# Patient Record
Sex: Female | Born: 1970 | Race: Black or African American | Hispanic: No | Marital: Single | Smoking: Current every day smoker
Health system: Southern US, Community
[De-identification: ages and names within clinical notes are randomized; demographics above are authoritative.]

## PROBLEM LIST (undated history)

## (undated) DIAGNOSIS — H8109 Meniere's disease, unspecified ear: Secondary | ICD-10-CM

## (undated) DIAGNOSIS — I10 Essential (primary) hypertension: Secondary | ICD-10-CM

## (undated) HISTORY — PX: APPENDECTOMY: SHX54

---

## 2006-04-25 ENCOUNTER — Emergency Department (HOSPITAL_COMMUNITY): Admission: EM | Admit: 2006-04-25 | Discharge: 2006-04-25 | Payer: Self-pay | Admitting: Emergency Medicine

## 2006-09-06 ENCOUNTER — Emergency Department (HOSPITAL_COMMUNITY): Admission: EM | Admit: 2006-09-06 | Discharge: 2006-09-06 | Payer: Self-pay | Admitting: Emergency Medicine

## 2007-04-21 ENCOUNTER — Ambulatory Visit: Payer: Self-pay | Admitting: Internal Medicine

## 2007-04-21 ENCOUNTER — Ambulatory Visit: Payer: Self-pay | Admitting: *Deleted

## 2007-05-18 ENCOUNTER — Ambulatory Visit: Payer: Self-pay | Admitting: Internal Medicine

## 2007-08-04 ENCOUNTER — Emergency Department (HOSPITAL_COMMUNITY): Admission: EM | Admit: 2007-08-04 | Discharge: 2007-08-04 | Payer: Self-pay | Admitting: Emergency Medicine

## 2007-08-07 DIAGNOSIS — I10 Essential (primary) hypertension: Secondary | ICD-10-CM | POA: Insufficient documentation

## 2007-08-08 ENCOUNTER — Ambulatory Visit: Payer: Self-pay | Admitting: Family Medicine

## 2007-08-08 DIAGNOSIS — H8309 Labyrinthitis, unspecified ear: Secondary | ICD-10-CM | POA: Insufficient documentation

## 2007-08-08 DIAGNOSIS — H8110 Benign paroxysmal vertigo, unspecified ear: Secondary | ICD-10-CM | POA: Insufficient documentation

## 2007-08-08 LAB — CONVERTED CEMR LAB
AST: 14 units/L (ref 0–37)
Chloride: 103 meq/L (ref 96–112)
Glucose, Bld: 81 mg/dL (ref 70–99)
Potassium: 4.2 meq/L (ref 3.5–5.3)
Total Bilirubin: 0.5 mg/dL (ref 0.3–1.2)

## 2008-07-24 ENCOUNTER — Emergency Department (HOSPITAL_COMMUNITY): Admission: EM | Admit: 2008-07-24 | Discharge: 2008-07-24 | Payer: Self-pay | Admitting: Emergency Medicine

## 2009-03-09 ENCOUNTER — Emergency Department (HOSPITAL_COMMUNITY): Admission: EM | Admit: 2009-03-09 | Discharge: 2009-03-09 | Payer: Self-pay | Admitting: Emergency Medicine

## 2009-07-08 ENCOUNTER — Emergency Department (HOSPITAL_COMMUNITY): Admission: EM | Admit: 2009-07-08 | Discharge: 2009-07-08 | Payer: Self-pay | Admitting: Family Medicine

## 2009-09-19 ENCOUNTER — Ambulatory Visit: Payer: Self-pay | Admitting: Physician Assistant

## 2009-09-19 DIAGNOSIS — N3000 Acute cystitis without hematuria: Secondary | ICD-10-CM | POA: Insufficient documentation

## 2009-09-19 DIAGNOSIS — N760 Acute vaginitis: Secondary | ICD-10-CM | POA: Insufficient documentation

## 2009-09-19 LAB — CONVERTED CEMR LAB
Blood in Urine, dipstick: NEGATIVE
Ketones, urine, test strip: NEGATIVE
Nitrite: NEGATIVE

## 2009-09-30 ENCOUNTER — Encounter (INDEPENDENT_AMBULATORY_CARE_PROVIDER_SITE_OTHER): Payer: Self-pay | Admitting: *Deleted

## 2009-11-18 ENCOUNTER — Emergency Department (HOSPITAL_COMMUNITY): Admission: EM | Admit: 2009-11-18 | Discharge: 2009-11-18 | Payer: Self-pay | Admitting: Family Medicine

## 2010-06-27 ENCOUNTER — Emergency Department (HOSPITAL_COMMUNITY): Admission: EM | Admit: 2010-06-27 | Discharge: 2010-06-27 | Payer: Self-pay | Admitting: Emergency Medicine

## 2010-10-13 ENCOUNTER — Ambulatory Visit: Payer: Self-pay | Admitting: Internal Medicine

## 2010-10-13 DIAGNOSIS — R609 Edema, unspecified: Secondary | ICD-10-CM | POA: Insufficient documentation

## 2010-10-13 DIAGNOSIS — F319 Bipolar disorder, unspecified: Secondary | ICD-10-CM

## 2010-10-13 LAB — CONVERTED CEMR LAB
BUN: 13 mg/dL (ref 6–23)
Calcium: 9.1 mg/dL (ref 8.4–10.5)
Creatinine, Ser: 0.76 mg/dL (ref 0.40–1.20)

## 2010-10-30 ENCOUNTER — Ambulatory Visit: Payer: Self-pay | Admitting: Internal Medicine
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
## 2010-12-22 NOTE — Assessment & Plan Note (Signed)
Summary: F/U Depression//mm   Vital Signs:  Patient profile:   40 year old female Menstrual status:  regular LMP:     10/09/2010 Height:      65 inches Weight:      224.25 pounds BMI:     37.45 Temp:     98.7 degrees F oral Pulse rate:   98 / minute Pulse rhythm:   regular Resp:     14 per minute BP sitting:   130 / 100  (left arm) Cuff size:   regular  Vitals Entered By: Hale Drone CMA (October 30, 2010 10:28 AM) CC: 2 week f/u on depression. Setraline making her sick and has stopped taking them. Seroquel makes her dizzy until she falls asleep. Hydrochlrothiazide has not helped with the swelling of the body and is not urinating much. Is Patient Diabetic? No Pain Assessment Patient in pain? no       Does patient need assistance? Functional Status Self care Ambulation Normal LMP (date): 10/09/2010     Enter LMP: 10/09/2010   CC:  2 week f/u on depression. Setraline making her sick and has stopped taking them. Seroquel makes her dizzy until she falls asleep. Hydrochlrothiazide has not helped with the swelling of the body and is not urinating much..  History of Present Illness: 1. Bipolar Disorder:  Could not tolerate Sertraline--caused nausea and vomiting and made mouth dry--tried for about 2 weeks.  .  The Seroquel made her dizzy, like the room is spinning, but did help with sleep.  Currently on 150 mg of Seroquel at bedtime.  2.  Hypertension:  Still having sense of swelling in hands and feet.  She feels she is urinating less.  Pt. is celibate currently and has no plans to get pregnant.    Current Medications (verified): 1)  Seroquel 25 Mg Tabs (Quetiapine Fumarate) .... Start 1 Tab By Mouth At Bedtime.  Increase By 1 Tab Every 3rd Night Until Taking 200 Mg or 8 Tabs At Bedtime and Stay On That Dose. 2)  Sertraline Hcl 50 Mg Tabs (Sertraline Hcl) .... 1/2 Tab By Mouth Daily For 7 Days, Then Increase To 1 Tab By Mouth Daily 3)  Hydrochlorothiazide 25 Mg Tabs  (Hydrochlorothiazide) .Marland Kitchen.. 1 Tab By Mouth Daily in Morning  Allergies (verified): No Known Drug Allergies  Physical Exam  General:  Not tearful today, smiling Lungs:  Normal respiratory effort, chest expands symmetrically. Lungs are clear to auscultation, no crackles or wheezes. Heart:  Normal rate and regular rhythm. S1 and S2 normal without gallop, murmur, click, rub or other extra sounds.  Radial pulses normal and equal   Impression & Recommendations:  Problem # 1:  BIPOLAR DISORDER UNSPECIFIED (ICD-296.80) Stop Sertraline and start Citalopram Continue titration of Seroquel Not sure why did not get appointed with [facility] Orders: Psychology Referral (Psychology)  Problem # 2:  HYPERTENSION (ICD-401.9) Add Lisinopril Discussed need to use birth control if becomes sexually active Her updated medication list for this problem includes:    Hydrochlorothiazide 25 Mg Tabs (Hydrochlorothiazide) .Marland Kitchen... 1 tab by mouth daily in morning    Lisinopril 10 Mg Tabs (Lisinopril) .Marland Kitchen... 1 tab by mouth daily with hctz  Complete Medication List: 1)  Seroquel 25 Mg Tabs (Quetiapine fumarate) .... Start 1 tab by mouth at bedtime.  increase by 1 tab every 3rd night until taking 200 mg or 8 tabs at bedtime and stay on that dose. 2)  Hydrochlorothiazide 25 Mg Tabs (Hydrochlorothiazide) .Marland Kitchen.. 1 tab by mouth  daily in morning 3)  Celexa 10 Mg Tabs (Citalopram hydrobromide) .... 1/2 tab by mouth daily for 7 days, then 1 tab o daily. 4)  Lisinopril 10 Mg Tabs (Lisinopril) .Marland Kitchen.. 1 tab by mouth daily with hctz  Other Orders: Tdap => 43yrs IM (16109) Admin 1st Vaccine (60454)  Patient Instructions: 1)  BP check and BMet--added Lisinopril today 2)  Stop the Sertraline 3)  Take Citalopram in its place 4)  Follow up with Dr. Delrae Alfred in 1 month --bipolar. Prescriptions: LISINOPRIL 10 MG TABS (LISINOPRIL) 1 tab by mouth daily with HCTZ  #30 x 11   Entered and Authorized by:   Julieanne Manson  MD   Signed by:   Julieanne Manson MD on 10/30/2010   Method used:   Electronically to        [facility] [phone]* (retail)       [address]       [place], [place]  [address]       Ph: [phone]       Fax: [phone]   RxID:   [id number] CELEXA 10 MG TABS (CITALOPRAM HYDROBROMIDE) 1/2 tab by mouth daily for 7 days, then 1 tab o daily.  #30 x 2   Entered and Authorized by:   Julieanne Manson MD   Signed by:   Julieanne Manson MD on 10/30/2010   Method used:   Electronically to        [facility] [phone]* (retail)       [address]       [place], [place]  [address]       Ph: [phone]       Fax: [phone]   RxID:   [id number]    Orders Added: 1)  Tdap => 95yrs IM [35573] 2)  Admin 1st Vaccine [90471] 3)  Psychology Referral [Psychology] 4)  Est. Patient Level III [22025]   Immunizations Administered:  Tetanus Vaccine:    Vaccine Type: Tdap    Site: left deltoid    Mfr: Sanofi Pasteur    Dose: 0.5 ml    Route: IM    Given by: Hale Drone CMA    Exp. Date: 02/18/2013    Lot #: [id number]    VIS given: 10/09/08 version given October 30, 2010.   Immunizations Administered:  Tetanus Vaccine:    Vaccine Type: Tdap    Site: left deltoid    Mfr: Sanofi Pasteur    Dose: 0.5 ml    Route: IM    Given by: Hale Drone CMA    Exp. Date: 02/18/2013    Lot #: [id number]    VIS given: 10/09/08 version given October 30, 2010.

## 2011-02-05 LAB — POCT URINALYSIS DIPSTICK
Bilirubin Urine: NEGATIVE
Glucose, UA: NEGATIVE mg/dL
Ketones, ur: NEGATIVE mg/dL
Nitrite: NEGATIVE
Specific Gravity, Urine: >=1.03 (ref 1.005–1.030)
Urobilinogen, UA: 0.2 mg/dL (ref 0.0–1.0)

## 2011-02-08 ENCOUNTER — Encounter (INDEPENDENT_AMBULATORY_CARE_PROVIDER_SITE_OTHER): Payer: Self-pay | Admitting: Nurse Practitioner

## 2011-02-08 ENCOUNTER — Telehealth (INDEPENDENT_AMBULATORY_CARE_PROVIDER_SITE_OTHER): Payer: Self-pay | Admitting: Internal Medicine

## 2011-02-08 ENCOUNTER — Encounter: Payer: Self-pay | Admitting: Nurse Practitioner

## 2011-02-08 DIAGNOSIS — R42 Dizziness and giddiness: Secondary | ICD-10-CM | POA: Insufficient documentation

## 2011-02-18 NOTE — Letter (Signed)
Summary: Handout Printed  Printed Handout:  - Vertigo (Labyrinthitis, Dizziness)

## 2011-02-18 NOTE — Assessment & Plan Note (Signed)
Summary: Vertigo   Vital Signs:  Patient profile:   40 year old female Menstrual status:  regular LMP:     02/05/2011 Temp:     98.3 degrees F oral Pulse rate:   73 / minute Pulse rhythm:   regular Resp:     20 per minute BP sitting:   123 / 86  (left arm) Cuff size:   regular  Vitals Entered By: Hale Drone CMA (February 08, 2011 1:24 PM) CC: Positional dizziness... Vomiting bile... Mid upper quadrant abd. pain... Afraid to eat or drink... All s/s since friday... Has fallen x2 but did not lose consciousness.... Rt. ear ringing realy loud. , Hypertension Management Is Patient Diabetic? No Pain Assessment Patient in pain? yes       Does patient need assistance? Functional Status Self care Ambulation Normal LMP (date): 02/05/2011     Enter LMP: 02/05/2011   CC:  Positional dizziness... Vomiting bile... Mid upper quadrant abd. pain... Afraid to eat or drink... All s/s since friday... Has fallen x2 but did not lose consciousness.... Rt. ear ringing realy loud.  and Hypertension Management.  History of Present Illness:   Pt into the office and she reports that 3 days ago when she was at school she started with extreme dizziness Pt repports that she has some dizziness to the point when she loss balance +nausea +vomiting feels like she is moving when she sits up  Hypertension History:      She denies headache, chest pain, and palpitations.  She notes no problems with any antihypertensive medication side effects.  taking bp meds as ordered.        Positive major cardiovascular risk factors include hypertension and current tobacco user.  Negative major cardiovascular risk factors include female age less than 69 years old.     Habits & Providers  Alcohol-Tobacco-Diet     Tobacco Status: current     Tobacco Counseling: to quit use of tobacco products     Year Quit: friday  Exercise-Depression-Behavior     Drug Use: yes  Current Medications (verified): 1)  Seroquel 25 Mg  Tabs (Quetiapine Fumarate) .... Start 1 Tab By Mouth At Bedtime.  Increase By 1 Tab Every 3rd Night Until Taking 200 Mg or 8 Tabs At Bedtime and Stay On That Dose. 2)  Hydrochlorothiazide 25 Mg Tabs (Hydrochlorothiazide) .Marland Kitchen.. 1 Tab By Mouth Daily in Morning 3)  Celexa 10 Mg Tabs (Citalopram Hydrobromide) .... 1/2 Tab By Mouth Daily For 7 Days, Then 1 Tab O Daily. 4)  Lisinopril 10 Mg Tabs (Lisinopril) .Marland Kitchen.. 1 Tab By Mouth Daily With Hctz  Allergies (verified): No Known Drug Allergies  Review of Systems General:  Denies fever; +dizziness. ENT:  Complains of ringing in ears. CV:  Denies chest pain or discomfort. Resp:  Denies cough. GI:  Complains of nausea and vomiting.  Physical Exam  General:  alert.   Head:  normocephalic.   Ears:  bil TM with bony landmarks present Msk:  normal ROM.   Neurologic:  alert & oriented X3.   Skin:  color normal.   Psych:  Oriented X3.     Impression & Recommendations:  Problem # 1:  VERTIGO (ICD-780.4) pt advised ot the dx will given phenergan IM  will start meclizine as needed  Her updated medication list for this problem includes:    Meclizine Hcl 25 Mg Tabs (Meclizine hcl) ..... One tablet by mouth two times a day as needed for dizziness    Loratadine 10  Mg Tabs (Loratadine) ..... One tablet by mouth daily for allergies  Orders: Promethazine up to 50mg  (J2550)  Complete Medication List: 1)  Seroquel 25 Mg Tabs (Quetiapine fumarate) .... Start 1 tab by mouth at bedtime.  increase by 1 tab every 3rd night until taking 200 mg or 8 tabs at bedtime and stay on that dose. 2)  Hydrochlorothiazide 25 Mg Tabs (Hydrochlorothiazide) .Marland Kitchen.. 1 tab by mouth daily in morning 3)  Celexa 10 Mg Tabs (Citalopram hydrobromide) .... 1/2 tab by mouth daily for 7 days, then 1 tab o daily. 4)  Lisinopril 10 Mg Tabs (Lisinopril) .Marland Kitchen.. 1 tab by mouth daily with hctz 5)  Meclizine Hcl 25 Mg Tabs (Meclizine hcl) .... One tablet by mouth two times a day as needed for  dizziness 6)  Loratadine 10 Mg Tabs (Loratadine) .... One tablet by mouth daily for allergies  Other Orders: Admin of Therapeutic Inj  intramuscular or subcutaneous (78469)  Hypertension Assessment/Plan:      The patient's hypertensive risk group is category B: At least one risk factor (excluding diabetes) with no target organ damage.  Today's blood pressure is 123/86.  Her blood pressure goal is < 140/90.  Patient Instructions: 1)  Start taking loratadine 10mg  by mouth daily to help with the fluid in your ear. 2)  Take the meclizine 25mg  by mouth two times a day on Monday, Tuesday and Wednesday.   3)  Follow up on Thursday with Triage nurse for appointment to check symptoms. suspect you may see some improvement but may need a few more days before complete resolution  4)  NO DRIVING Prescriptions: MECLIZINE HCL 25 MG TABS (MECLIZINE HCL) One tablet by mouth two times a day as needed for dizziness  #30 x 0   Entered and Authorized by:   Lehman Prom FNP   Signed by:   Lehman Prom FNP on 02/08/2011   Method used:   Electronically to        [facility] [phone]* (retail)       [address]       [place], [place]  [address]       Ph: [phone]       Fax: [phone]   RxID:   [id number] LORATADINE 10 MG TABS (LORATADINE) One tablet by mouth daily for allergies  #30 x 0   Entered and Authorized by:   Lehman Prom FNP   Signed by:   Lehman Prom FNP on 02/08/2011   Method used:   Electronically to        [facility] [phone]* (retail)       [address]       [place], [place]  [address]       Ph: [phone]       Fax: [phone]   RxID:   [id number] LORATADINE 10 MG TABS (LORATADINE) One tablet by mouth daily for allergies  #30 x 0   Entered and Authorized by:   Lehman Prom FNP   Signed by:   Lehman Prom FNP on 02/08/2011   Method used:   Print then Give to Patient   RxID:   [id number] MECLIZINE HCL 25 MG TABS (MECLIZINE HCL)  One tablet by mouth two times a day as needed for dizziness  #30 x 0   Entered and Authorized by:   Lehman Prom FNP   Signed by:   Lehman Prom FNP on 02/08/2011   Method used:   Print then Give to Patient  RxID:   [id number]    Medication Administration  Injection # 1:    Medication: Promethazine up to 50mg     Diagnosis: VERTIGO (ICD-780.4)    Route: IM    Site: LUOQ gluteus    Exp Date: 04/2012    Lot #: [id number]    Mfr: [email or web address]    Comments: QMV [id number]    Patient tolerated injection without complications    Given by: Hale Drone CMA (February 08, 2011 3:27 PM)  Orders Added: 1)  Promethazine up to 50mg  [J2550] 2)  Admin of Therapeutic Inj  intramuscular or subcutaneous [96372] 3)  Est. Patient Level III [84132]

## 2011-02-18 NOTE — Progress Notes (Signed)
Summary: Dizzy, nausea, R ear ringing  Phone Note Call from Patient Call back at [phone]   Summary of Call: Has had nausea, light-headed, eyes hurt, for the last week, off and on.  Sounds very anxious.  Stomach messed up, no diarrhea.  Has menses currently.  Headache behind eye, R ear ringing, dizzy.  Scheduled for acute OV with Jesse Fall today. Initial call taken by: Conchita Paris RN,  February 08, 2011 10:16 AM  Follow-up for Phone Call        pt was seen by [facility] Follow-up by: Arm[place] Shannon,  February 09, 2011 11:33 AM

## 2011-02-21 ENCOUNTER — Emergency Department (HOSPITAL_COMMUNITY)
Admission: EM | Admit: 2011-02-21 | Discharge: 2011-02-21 | Disposition: A | Discharge status: Home / Self Care | Payer: Medicaid Other | Attending: Emergency Medicine | Admitting: Emergency Medicine

## 2011-02-21 ENCOUNTER — Emergency Department (HOSPITAL_COMMUNITY): Payer: Medicaid Other

## 2011-02-21 DIAGNOSIS — R29898 Other symptoms and signs involving the musculoskeletal system: Secondary | ICD-10-CM | POA: Insufficient documentation

## 2011-02-21 DIAGNOSIS — Z79899 Other long term (current) drug therapy: Secondary | ICD-10-CM | POA: Insufficient documentation

## 2011-02-21 DIAGNOSIS — R112 Nausea with vomiting, unspecified: Secondary | ICD-10-CM | POA: Insufficient documentation

## 2011-02-21 DIAGNOSIS — H5500 Unspecified nystagmus: Secondary | ICD-10-CM | POA: Insufficient documentation

## 2011-02-21 DIAGNOSIS — I10 Essential (primary) hypertension: Secondary | ICD-10-CM | POA: Insufficient documentation

## 2011-02-21 DIAGNOSIS — R42 Dizziness and giddiness: Secondary | ICD-10-CM | POA: Insufficient documentation

## 2011-02-21 DIAGNOSIS — R51 Headache: Secondary | ICD-10-CM | POA: Insufficient documentation

## 2011-02-21 LAB — BASIC METABOLIC PANEL
BUN: 13 mg/dL (ref 6–23)
CO2: 24 mEq/L (ref 19–32)
Calcium: 8.7 mg/dL (ref 8.4–10.5)
Chloride: 103 mEq/L (ref 96–112)
GFR calc Af Amer: >60 mL/min (ref >60)
Sodium: 136 mEq/L (ref 135–145)

## 2011-02-21 LAB — CBC
Hemoglobin: 13.2 g/dL (ref 12.0–15.0)
MCH: 28.1 pg (ref 26.0–34.0)
MCHC: 34 g/dL (ref 30.0–36.0)
MCV: 82.7 fL (ref 78.0–100.0)
RDW: 16.1 % — ABNORMAL HIGH (ref 11.5–15.5)
WBC: 8.6 10*3/uL (ref 4.0–10.5)

## 2011-02-21 LAB — DIFFERENTIAL
Basophils Absolute: 0.1 10*3/uL (ref 0.0–0.1)
Basophils Relative: 1 % (ref 0–1)
Eosinophils Absolute: 0.3 10*3/uL (ref 0.0–0.7)
Eosinophils Relative: 3 % (ref 0–5)
Lymphocytes Relative: 18 % (ref 12–46)
Monocytes Absolute: 0.9 10*3/uL (ref 0.1–1.0)
Neutrophils Relative %: 68 % (ref 43–77)

## 2011-03-24 ENCOUNTER — Emergency Department (HOSPITAL_COMMUNITY)
Admission: EM | Admit: 2011-03-24 | Discharge: 2011-03-24 | Disposition: A | Discharge status: Home / Self Care | Payer: Medicaid Other | Attending: Emergency Medicine | Admitting: Emergency Medicine

## 2011-03-24 DIAGNOSIS — IMO0002 Reserved for concepts with insufficient information to code with codable children: Secondary | ICD-10-CM | POA: Insufficient documentation

## 2011-03-24 DIAGNOSIS — S0180XA Unspecified open wound of other part of head, initial encounter: Secondary | ICD-10-CM | POA: Insufficient documentation

## 2011-03-24 DIAGNOSIS — Y929 Unspecified place or not applicable: Secondary | ICD-10-CM | POA: Insufficient documentation

## 2011-04-14 ENCOUNTER — Inpatient Hospital Stay (INDEPENDENT_AMBULATORY_CARE_PROVIDER_SITE_OTHER)
Admission: RE | Admit: 2011-04-14 | Discharge: 2011-04-14 | Disposition: A | Discharge status: Home / Self Care | Payer: Medicaid Other | Source: Ambulatory Visit | Attending: Emergency Medicine | Admitting: Emergency Medicine

## 2011-04-14 DIAGNOSIS — Z0489 Encounter for examination and observation for other specified reasons: Secondary | ICD-10-CM

## 2011-08-25 LAB — URINALYSIS, ROUTINE W REFLEX MICROSCOPIC
Glucose, UA: NEGATIVE
Ketones, ur: 15 — AB
Urobilinogen, UA: 1

## 2011-08-25 LAB — URINE MICROSCOPIC-ADD ON

## 2011-08-25 LAB — URINE CULTURE: Colony Count: 30000

## 2011-09-03 LAB — COMPREHENSIVE METABOLIC PANEL
ALT: 9
AST: 13
Alkaline Phosphatase: 60
GFR calc non Af Amer: >60
Total Bilirubin: 0.2 — ABNORMAL LOW

## 2011-09-03 LAB — DIFFERENTIAL
Basophils Relative: 0
Eosinophils Relative: 6 — ABNORMAL HIGH
Lymphocytes Relative: 18
Neutro Abs: 4.7
Neutrophils Relative %: 68

## 2011-09-03 LAB — CBC
MCHC: 32.8
MCV: 84.5
RDW: 18.3 — ABNORMAL HIGH

## 2011-09-03 LAB — URINALYSIS, ROUTINE W REFLEX MICROSCOPIC
Bilirubin Urine: NEGATIVE
Nitrite: NEGATIVE
Protein, ur: NEGATIVE
Specific Gravity, Urine: 1.029

## 2011-09-03 LAB — LIPASE, BLOOD: Lipase: 21

## 2011-09-03 LAB — URINE CULTURE: Culture: NO GROWTH

## 2011-09-03 LAB — URINE MICROSCOPIC-ADD ON

## 2011-10-11 ENCOUNTER — Emergency Department (INDEPENDENT_AMBULATORY_CARE_PROVIDER_SITE_OTHER)
Admission: EM | Admit: 2011-10-11 | Discharge: 2011-10-11 | Disposition: A | Discharge status: Home / Self Care | Payer: Self-pay | Source: Home / Self Care

## 2011-10-11 DIAGNOSIS — S39012A Strain of muscle, fascia and tendon of lower back, initial encounter: Secondary | ICD-10-CM

## 2011-10-11 DIAGNOSIS — S335XXA Sprain of ligaments of lumbar spine, initial encounter: Secondary | ICD-10-CM

## 2011-10-11 HISTORY — DX: Essential (primary) hypertension: I10

## 2011-10-11 MED ORDER — IBUPROFEN 800 MG PO TABS: 800.0000 mg | ORAL_TABLET | Freq: Three times a day (TID) | ORAL | Status: AC

## 2011-10-11 MED ORDER — HYDROCODONE-ACETAMINOPHEN 5-325 MG PO TABS
1.0000 | ORAL_TABLET | Freq: Once | ORAL | Status: AC
  Administered 2011-10-11: 1 via ORAL

## 2011-10-11 MED ORDER — HYDROCODONE-ACETAMINOPHEN 5-325 MG PO TABS
ORAL_TABLET | ORAL | Status: AC
  Filled 2011-10-11: qty 1

## 2011-10-11 MED ORDER — HYDROCODONE-ACETAMINOPHEN 5-325 MG PO TABS: 1.0000 | ORAL_TABLET | Freq: Four times a day (QID) | ORAL | Status: AC | PRN

## 2011-10-11 MED ORDER — METHOCARBAMOL 750 MG PO TABS: 750.0000 mg | ORAL_TABLET | Freq: Four times a day (QID) | ORAL | Status: AC

## 2011-10-11 NOTE — ED Notes (Signed)
PATIENT STATES THAT WHEN SHE GOT OUT OF BED THE OTHER DAY, she had sudden onst of pain in her  Right back and right  buttocks; went on about her business, but later that day, the  Pain progressed until she had to get onto the heating pad ; has reportedly bee using motrin 800 q 4 h for her pain w minimal relief and has GI upset ; not eating due to GI upset; NAD

## 2011-10-11 NOTE — ED Provider Notes (Signed)
Medical screening examination/treatment/procedure(s) were performed by non-physician practitioner and as supervising physician I was immediately available for consultation/collaboration.  Raynald Blend, MD 10/11/11 1513

## 2011-10-11 NOTE — ED Provider Notes (Signed)
History     CSN: [id number] Arrival date & time: 10/11/2011 12:51 PM   None     Chief Complaint  Patient presents with  . Back Pain    (Consider location/radiation/quality/duration/timing/severity/associated sxs/prior treatment) Patient is a 40 y.o. female presenting with back pain. The history is provided by the patient.  Back Pain  This is a new problem. The current episode started 2 days ago. The problem occurs constantly. The problem has not changed since onset.The pain is associated with no known injury. The pain is present in the lumbar spine (right side). Quality: sharp. The pain does not radiate. The pain is severe. The symptoms are aggravated by certain positions. The pain is the same all the time. Pertinent negatives include no chest pain, no fever, no numbness, no abdominal pain, no bowel incontinence, no perianal numbness, no bladder incontinence, no dysuria, no leg pain, no paresthesias, no tingling and no weakness. She has tried NSAIDs for the symptoms. The treatment provided mild relief. Risk factors include obesity, lack of exercise and a sedentary lifestyle.    Past Medical History  Diagnosis Date  . Hypertension     Past Surgical History  Procedure Date  . Appendectomy   . Cesarean section w/btl     History reviewed. No pertinent family history.  History  Substance Use Topics  . Smoking status: Current Everyday Smoker  . Smokeless tobacco: Not on file  . Alcohol Use: No    OB History    Grav Para Term Preterm Abortions TAB SAB Ect Mult Living                  Review of Systems  Constitutional: Negative for fever and chills.  Respiratory: Negative for shortness of breath.   Cardiovascular: Negative for chest pain.  Gastrointestinal: Negative for nausea, vomiting, abdominal pain, diarrhea, constipation and bowel incontinence.  Genitourinary: Negative for bladder incontinence, dysuria, urgency and frequency.  Musculoskeletal: Positive for back pain.    Neurological: Negative for tingling, weakness, numbness and paresthesias.    Allergies  Chocolate and Peanut-containing drug products  Home Medications  No current outpatient prescriptions on file.  BP 134/86  Pulse 69  Temp(Src) 97.9 F (36.6 C) (Oral)  Resp 16  SpO2 100%  LMP 09/23/2011  Physical Exam  Nursing note and vitals reviewed. Constitutional: She appears well-developed and well-nourished.       NAD, but does appear uncomfortable  Cardiovascular: Normal rate, regular rhythm and normal heart sounds.   Pulmonary/Chest: Effort normal and breath sounds normal. No respiratory distress.  Abdominal: Soft. She exhibits no mass. There is no tenderness.  Musculoskeletal:       Lumbar back: She exhibits decreased range of motion (flexion limited to approx 30 degress), tenderness and spasm. She exhibits no bony tenderness.       Back:       Pt stands leaning to Lt. Unable to stand upright due to discomfort. Bilat SI joints and sciatic notches nontender.   Neurological: She is alert. She has normal reflexes.  Skin: Skin is warm and dry.  Psychiatric: She has a normal mood and affect.    ED Course  Procedures (including critical care time)  Labs Reviewed - No data to display No results found.   No diagnosis found.    MDM          Melody Comas, PA 10/11/11 [phone]

## 2011-10-25 ENCOUNTER — Emergency Department (INDEPENDENT_AMBULATORY_CARE_PROVIDER_SITE_OTHER)
Admission: EM | Admit: 2011-10-25 | Discharge: 2011-10-25 | Disposition: A | Discharge status: Home / Self Care | Payer: Medicaid Other | Source: Home / Self Care | Attending: Family Medicine | Admitting: Family Medicine

## 2011-10-25 ENCOUNTER — Encounter (HOSPITAL_COMMUNITY): Payer: Self-pay | Admitting: *Deleted

## 2011-10-25 DIAGNOSIS — J020 Streptococcal pharyngitis: Secondary | ICD-10-CM

## 2011-10-25 MED ORDER — AMOXICILLIN 500 MG PO CAPS: 500.0000 mg | ORAL_CAPSULE | Freq: Three times a day (TID) | ORAL | Status: AC

## 2011-10-25 NOTE — ED Provider Notes (Addendum)
History     CSN: [id number] Arrival date & time: 10/25/2011  4:16 PM   First MD Initiated Contact with Patient 10/25/11 1627      Chief Complaint  Patient presents with  . Sore Throat    (Consider location/radiation/quality/duration/timing/severity/associated sxs/prior treatment) Patient is a 40 y.o. female presenting with pharyngitis.  Sore Throat This is a new problem. The current episode started more than 2 days ago (son with strep throat, pt reports fever over weekend.). The problem occurs constantly. The problem has been gradually worsening. The symptoms are aggravated by swallowing.    Past Medical History  Diagnosis Date  . Hypertension     Past Surgical History  Procedure Date  . Appendectomy   . Cesarean section w/btl     History reviewed. No pertinent family history.  History  Substance Use Topics  . Smoking status: Current Everyday Smoker  . Smokeless tobacco: Not on file  . Alcohol Use: No    OB History    Grav Para Term Preterm Abortions TAB SAB Ect Mult Living                  Review of Systems  Constitutional: Positive for fever.  HENT: Positive for congestion, sore throat, rhinorrhea and postnasal drip.   Respiratory: Negative.   Cardiovascular: Negative.   Gastrointestinal: Negative.   Skin: Negative.     Allergies  Chocolate and Peanut-containing drug products  Home Medications  No current outpatient prescriptions on file.  BP 139/95  Pulse 85  Temp(Src) 99.9 F (37.7 C) (Oral)  Resp 18  SpO2 100%  LMP 10/20/2011  Physical Exam  Constitutional: She appears well-developed and well-nourished.  HENT:  Head: Normocephalic.  Right Ear: External ear normal.  Left Ear: External ear normal.  Mouth/Throat: Mucous membranes are normal. Posterior oropharyngeal erythema present. No oropharyngeal exudate, posterior oropharyngeal edema or tonsillar abscesses.  Neck: Normal range of motion. Neck supple.  Cardiovascular: Normal rate,  regular rhythm, normal heart sounds and intact distal pulses.   Pulmonary/Chest: Effort normal and breath sounds normal.  Lymphadenopathy:    She has cervical adenopathy.  Skin: Skin is warm and dry.    ED Course  Procedures (including critical care time)  Labs Reviewed - No data to display No results found.   No diagnosis found.    MDM  Strep neg.        Barkley Bruns, MD 10/25/11 1703  Barkley Bruns, MD 10/25/11 [phone]

## 2011-10-25 NOTE — ED Notes (Signed)
Pt  Reports  Symptoms  Of  sorethroat  With pain      When swallows  Also  Has  Fever  Chills  Decreased  Appetite  X 3 days

## 2012-03-14 ENCOUNTER — Emergency Department (HOSPITAL_COMMUNITY)
Admission: EM | Admit: 2012-03-14 | Discharge: 2012-03-14 | Disposition: A | Discharge status: Home / Self Care | Payer: Medicaid Other | Source: Home / Self Care | Attending: Family Medicine | Admitting: Family Medicine

## 2012-03-14 ENCOUNTER — Emergency Department (INDEPENDENT_AMBULATORY_CARE_PROVIDER_SITE_OTHER): Payer: Medicaid Other

## 2012-03-14 ENCOUNTER — Encounter (HOSPITAL_COMMUNITY): Payer: Self-pay | Admitting: Emergency Medicine

## 2012-03-14 DIAGNOSIS — M25551 Pain in right hip: Secondary | ICD-10-CM

## 2012-03-14 DIAGNOSIS — M25559 Pain in unspecified hip: Secondary | ICD-10-CM

## 2012-03-14 MED ORDER — DICLOFENAC POTASSIUM 50 MG PO TABS: 50.0000 mg | ORAL_TABLET | Freq: Three times a day (TID) | ORAL | Status: DC

## 2012-03-14 NOTE — ED Notes (Signed)
Right leg pain , onset 4 weeks ago.  No known injury.  Initially felt aching in upper leg/pelvis crease.  Reports as deep pain.  Denies back pain.  Pain has increased over time, episodes more frequent and more severe, now has numbness in right lower leg.  Numbness comes and goes.  Patient reports numbness occurs with walking. Patient did fall today while walking.  Reports leg went numb and she fell.  Patient reports tightness and swelling in leg.  Numbness is usually from ankle down and including foot.  Currently not numb.  Touch feels normal, pulses palpable, brisk cap refill.  Right lower leg is larger than left lower leg.

## 2012-03-14 NOTE — Discharge Instructions (Signed)
Use crutches and medicine as needed for hip pains, see orthopedist this week if further problems.

## 2012-03-14 NOTE — ED Provider Notes (Signed)
History     CSN: [id number]  Arrival date & time 03/14/12  1157   First MD Initiated Contact with Patient 03/14/12 1207      Chief Complaint  Patient presents with  . Leg Pain    (Consider location/radiation/quality/duration/timing/severity/associated sxs/prior treatment) Patient is a 41 y.o. female presenting with leg pain. The history is provided by the patient.  Leg Pain  The incident occurred more than 1 week ago (increasing pain over past 4 weeks.). The incident occurred at home. There was no injury mechanism. The pain is present in the right hip. The quality of the pain is described as aching. The pain is moderate. The pain has been worsening (has fallen today as a result of pain and progressive intermittent numb feeling.) since onset. Associated symptoms include numbness.    Past Medical History  Diagnosis Date  . Hypertension     Past Surgical History  Procedure Date  . Appendectomy   . Cesarean section w/btl     No family history on file.  History  Substance Use Topics  . Smoking status: Current Everyday Smoker  . Smokeless tobacco: Not on file  . Alcohol Use: No    OB History    Grav Para Term Preterm Abortions TAB SAB Ect Mult Living                  Review of Systems  Constitutional: Negative.   Gastrointestinal: Negative.   Musculoskeletal: Positive for joint swelling. Negative for back pain.  Neurological: Positive for numbness.    Allergies  Chocolate and Peanut-containing drug products  Home Medications   Current Outpatient Rx  Name Route Sig Dispense Refill  . IBUPROFEN 200 MG PO TABS Oral Take 200 mg by mouth every 6 (six) hours as needed. Reports taking ibuprofen 5 -200mg  at one time, then 5 hours later reports taking 3-goody powders, alternating this routine    . DICLOFENAC POTASSIUM 50 MG PO TABS Oral Take 1 tablet (50 mg total) by mouth 3 (three) times daily. 30 tablet 0  . HYDROCHLOROTHIAZIDE PO Oral Take by mouth. Ran out 3 months  ago      BP 131/90  Pulse 94  Temp(Src) 98.3 F (36.8 C) (Oral)  Resp 16  SpO2 100%  LMP 02/22/2012  Physical Exam  Nursing note and vitals reviewed. Constitutional: She is oriented to person, place, and time. She appears well-developed and well-nourished.  Abdominal: Soft. Bowel sounds are normal.  Musculoskeletal: She exhibits tenderness.       Right hip: She exhibits decreased range of motion and tenderness.       Tr edema to right leg, pulses nl bilat., nl motor and sens.  Neurological: She is alert and oriented to person, place, and time.  Skin: Skin is warm and dry.    ED Course  Procedures (including critical care time)  Labs Reviewed - No data to display Dg Hip Complete Right  03/14/2012  *RADIOLOGY REPORT*  Clinical Data: Right hip pain.  RIGHT HIP - COMPLETE 2+ VIEW  Comparison: None.  Findings: No evidence of right hip fracture or dislocation.  No evidence of arthritis or other focal bone lesions.  A small well- corticated ossific density is seen in the soft tissues lateral to the right hip, which may be due to old trauma or accessory ossicle.  IMPRESSION: No acute findings.  Original Report Authenticated By: Danae Orleans, M.D.     1. Hip pain, right       MDM  X-rays reviewed and report per radiologist.         Linna Hoff, MD 03/14/12 1415

## 2012-03-14 NOTE — ED Notes (Signed)
Has appt with dr Delrae Alfred at [facility] on Monday for medication referral. Reports hctz was medication, ran out 3 months ago

## 2012-03-14 NOTE — ED Notes (Signed)
Pants removed for physician examination.

## 2012-04-15 ENCOUNTER — Emergency Department (HOSPITAL_COMMUNITY)
Admission: EM | Admit: 2012-04-15 | Discharge: 2012-04-15 | Disposition: A | Discharge status: Home / Self Care | Payer: Self-pay | Attending: Emergency Medicine | Admitting: Emergency Medicine

## 2012-04-15 ENCOUNTER — Emergency Department (HOSPITAL_COMMUNITY): Payer: Self-pay

## 2012-04-15 ENCOUNTER — Encounter (HOSPITAL_COMMUNITY): Payer: Self-pay | Admitting: Emergency Medicine

## 2012-04-15 DIAGNOSIS — Z91199 Patient's noncompliance with other medical treatment and regimen due to unspecified reason: Secondary | ICD-10-CM | POA: Insufficient documentation

## 2012-04-15 DIAGNOSIS — I10 Essential (primary) hypertension: Secondary | ICD-10-CM | POA: Insufficient documentation

## 2012-04-15 DIAGNOSIS — Z9119 Patient's noncompliance with other medical treatment and regimen: Secondary | ICD-10-CM | POA: Insufficient documentation

## 2012-04-15 DIAGNOSIS — F17200 Nicotine dependence, unspecified, uncomplicated: Secondary | ICD-10-CM | POA: Insufficient documentation

## 2012-04-15 DIAGNOSIS — R112 Nausea with vomiting, unspecified: Secondary | ICD-10-CM | POA: Insufficient documentation

## 2012-04-15 DIAGNOSIS — R42 Dizziness and giddiness: Secondary | ICD-10-CM | POA: Insufficient documentation

## 2012-04-15 DIAGNOSIS — Z9114 Patient's other noncompliance with medication regimen: Secondary | ICD-10-CM

## 2012-04-15 LAB — POCT I-STAT, CHEM 8
BUN: 8 mg/dL (ref 6–23)
Chloride: 108 mEq/L (ref 96–112)
Potassium: 3.5 mEq/L (ref 3.5–5.1)
Sodium: 141 mEq/L (ref 135–145)
TCO2: 23 mmol/L (ref 0–100)

## 2012-04-15 LAB — HCG, SERUM, QUALITATIVE: Preg, Serum: NEGATIVE

## 2012-04-15 MED ORDER — ONDANSETRON HCL 4 MG/2ML IJ SOLN
INTRAMUSCULAR | Status: AC
  Filled 2012-04-15: qty 2

## 2012-04-15 MED ORDER — SODIUM CHLORIDE 0.9 % IV BOLUS (SEPSIS)
1000.0000 mL | Freq: Once | INTRAVENOUS | Status: AC
  Administered 2012-04-15: 1000 mL via INTRAVENOUS

## 2012-04-15 MED ORDER — HYDROCHLOROTHIAZIDE 25 MG PO TABS: 25.0000 mg | ORAL_TABLET | Freq: Every day | ORAL | Status: AC

## 2012-04-15 MED ORDER — DIAZEPAM 5 MG PO TABS: 5.0000 mg | ORAL_TABLET | Freq: Two times a day (BID) | ORAL | Status: AC

## 2012-04-15 MED ORDER — ONDANSETRON HCL 4 MG/2ML IJ SOLN
4.0000 mg | Freq: Once | INTRAMUSCULAR | Status: AC
  Administered 2012-04-15: 4 mg via INTRAVENOUS

## 2012-04-15 MED ORDER — PROMETHAZINE HCL 25 MG PO TABS: 25.0000 mg | ORAL_TABLET | Freq: Four times a day (QID) | ORAL | Status: DC | PRN

## 2012-04-15 MED ORDER — DIAZEPAM 5 MG/ML IJ SOLN
5.0000 mg | Freq: Once | INTRAMUSCULAR | Status: AC
  Administered 2012-04-15: 5 mg via INTRAVENOUS

## 2012-04-15 MED ORDER — DIAZEPAM 5 MG/ML IJ SOLN
INTRAMUSCULAR | Status: AC
  Filled 2012-04-15: qty 2

## 2012-04-15 NOTE — ED Notes (Signed)
Performed AIDET. Introduced self to Pt. Family at bedside. No needs at this time

## 2012-04-15 NOTE — ED Notes (Signed)
Explained delay to PT

## 2012-04-15 NOTE — Discharge Instructions (Signed)

## 2012-04-15 NOTE — ED Notes (Signed)
Pt provided beverage upon request

## 2012-04-15 NOTE — ED Provider Notes (Addendum)
History     CSN: [id number]  Arrival date & time 04/15/12  1510   First MD Initiated Contact with Patient 04/15/12 1529      Chief Complaint  Patient presents with  . Dizziness     HPI Patient brought in by EMS with chief complaint of vertigo associated with nausea vomiting and retching which started around 2:00.  Patient has been noncompliant with her blood pressure medications.  Blood pressure upon arrival by EMS was 2 care 1:30.  Blood pressure reading was 150/98 in route was 150/93 emergency room.  Patient was having active vertigo nausea and vomiting.  Patient denies headache.  Patient has previous history of same in the past. Past Medical History  Diagnosis Date  . Hypertension     Past Surgical History  Procedure Date  . Appendectomy   . Cesarean section w/btl     History reviewed. No pertinent family history.  History  Substance Use Topics  . Smoking status: Current Everyday Smoker  . Smokeless tobacco: Not on file  . Alcohol Use: No    OB History    Grav Para Term Preterm Abortions TAB SAB Ect Mult Living                  Review of Systems  Unable to perform ROS: Other    Allergies  Chocolate and Peanut-containing drug products  Home Medications   Current Outpatient Rx  Name Route Sig Dispense Refill  . DIAZEPAM 5 MG PO TABS Oral Take 1 tablet (5 mg total) by mouth 2 (two) times daily. 15 tablet 0  . HYDROCHLOROTHIAZIDE 25 MG PO TABS Oral Take 1 tablet (25 mg total) by mouth daily. 30 tablet 0  . PROMETHAZINE HCL 25 MG PO TABS Oral Take 1 tablet (25 mg total) by mouth every 6 (six) hours as needed for nausea. 15 tablet 0    BP 129/71  Pulse 65  Temp(Src) 98.3 F (36.8 C) (Oral)  Resp 18  SpO2 99%  LMP 04/08/2012  Physical Exam  Nursing note and vitals reviewed. Constitutional: She is oriented to person, place, and time. She appears well-developed and well-nourished. She appears distressed.       Patient very vertiginous with active  vomiting.  HENT:  Head: Normocephalic and atraumatic.  Eyes: Pupils are equal, round, and reactive to light.  Neck: Normal range of motion.  Cardiovascular: Normal rate and intact distal pulses.         Date: 04/15/2012  Rate: 76  Rhythm: normal sinus rhythm  QRS Axis: normal  Intervals: normal  ST/T Wave abnormalities: normal  Conduction Disutrbances: none  Narrative Interpretation: unremarkable      Pulmonary/Chest: No respiratory distress. She has no wheezes.  Abdominal: Normal appearance. She exhibits no distension. There is no tenderness. There is no rebound.  Musculoskeletal: Normal range of motion.  Neurological: She is alert and oriented to person, place, and time. No cranial nerve deficit.  Skin: Skin is warm and dry. No rash noted.  Psychiatric: She has a normal mood and affect. Her behavior is normal.    ED Course  Procedures (including critical care time) Scheduled Meds:    . diazepam  5 mg Intravenous Once  . ondansetron  4 mg Intravenous Once  . sodium chloride  1,000 mL Intravenous Once   Continuous Infusions:  PRN Meds:.   Labs Reviewed  HCG, SERUM, QUALITATIVE  POCT I-STAT, CHEM 8   Ct Head Wo Contrast  04/15/2012  *RADIOLOGY REPORT*  Clinical Data: Headache, nausea, dizziness, hypertension  CT HEAD WITHOUT CONTRAST  Technique:  Contiguous axial images were obtained from the base of the skull through the vertex without contrast.  Comparison: MRI brain dated 02/21/2011  Findings: No evidence of parenchymal hemorrhage or extra-axial fluid collection. No mass lesion, mass effect, or midline shift.  No CT evidence of acute infarction.  Cerebral volume is age appropriate.  No ventriculomegaly.  The visualized paranasal sinuses are essentially clear. The mastoid air cells are unopacified.  No evidence of calvarial fracture.  IMPRESSION: No evidence of acute intracranial abnormality.  Original Report Authenticated By: Charline Bills, M.D.     1. VERTIGO     2. History of medication noncompliance   3. Hypertension       MDM   Patient feels much better wants to go home.  We'll give prescriptions for HCTZ and Valium.       Nelia Shi, MD 04/15/12 1924  Nelia Shi, MD 04/15/12 [phone]

## 2012-04-15 NOTE — ED Notes (Addendum)
Pt not co-operative in answering questions, using a raised voice and at times ignoring staff.

## 2012-04-15 NOTE — ED Notes (Signed)
Pt brought in via EMS for headache that started around 1400 when pt woke from a nap. Pt reports nausea and dizzy, has not taken BP meds due to lack of funds. BP on EMS arrival 210/130, RR 32, 98% RA. In route to ED B/P 150/98, RR 16 per EMS.

## 2012-10-31 ENCOUNTER — Emergency Department (INDEPENDENT_AMBULATORY_CARE_PROVIDER_SITE_OTHER): Payer: Medicaid Other

## 2012-10-31 ENCOUNTER — Encounter (HOSPITAL_COMMUNITY): Payer: Self-pay | Admitting: Emergency Medicine

## 2012-10-31 ENCOUNTER — Emergency Department (HOSPITAL_COMMUNITY)
Admission: EM | Admit: 2012-10-31 | Discharge: 2012-10-31 | Disposition: A | Discharge status: Home / Self Care | Payer: Medicaid Other | Source: Home / Self Care | Attending: Family Medicine | Admitting: Family Medicine

## 2012-10-31 DIAGNOSIS — K5289 Other specified noninfective gastroenteritis and colitis: Secondary | ICD-10-CM

## 2012-10-31 DIAGNOSIS — K529 Noninfective gastroenteritis and colitis, unspecified: Secondary | ICD-10-CM

## 2012-10-31 DIAGNOSIS — J410 Simple chronic bronchitis: Secondary | ICD-10-CM

## 2012-10-31 DIAGNOSIS — Z720 Tobacco use: Secondary | ICD-10-CM

## 2012-10-31 LAB — POCT RAPID STREP A: Streptococcus, Group A Screen (Direct): NEGATIVE

## 2012-10-31 MED ORDER — ONDANSETRON 4 MG PO TBDP
ORAL_TABLET | ORAL | Status: AC
  Filled 2012-10-31: qty 1

## 2012-10-31 MED ORDER — ONDANSETRON HCL 4 MG PO TABS: 4.0000 mg | ORAL_TABLET | Freq: Four times a day (QID) | ORAL | Status: DC

## 2012-10-31 MED ORDER — ONDANSETRON 4 MG PO TBDP
4.0000 mg | ORAL_TABLET | Freq: Once | ORAL | Status: AC
  Administered 2012-10-31: 4 mg via ORAL

## 2012-10-31 MED ORDER — LEVOFLOXACIN 500 MG PO TABS: 500.0000 mg | ORAL_TABLET | Freq: Every day | ORAL | Status: DC

## 2012-10-31 NOTE — ED Notes (Signed)
Reports onset Saturday evening of symptoms.  Initially had nausea and vomiting.  Sunday head and ears were hurting.  Monday had a return of vomiting, chills, cough, and deep yellow phlegm, and chest discomfort with cough

## 2012-10-31 NOTE — ED Provider Notes (Signed)
History     CSN: [id number]  Arrival date & time 10/31/12  1013   First MD Initiated Contact with Patient 10/31/12 1040      Chief Complaint  Patient presents with  . Headache    (Consider location/radiation/quality/duration/timing/severity/associated sxs/prior treatment) Patient is a 41 y.o. female presenting with cough.  Cough This is a new problem. The current episode started more than 2 days ago. The problem has not changed since onset.The cough is productive of brown sputum. The maximum temperature recorded prior to her arrival was 100 to 100.9 F. Associated symptoms include headaches and rhinorrhea. Pertinent negatives include no sore throat. Associated symptoms comments: Vomiting x4 yest and twice today.. She is a smoker. Her past medical history is significant for bronchitis.    Past Medical History  Diagnosis Date  . Hypertension     Past Surgical History  Procedure Date  . Appendectomy   . Cesarean section w/btl     No family history on file.  History  Substance Use Topics  . Smoking status: Current Every Day Smoker  . Smokeless tobacco: Not on file  . Alcohol Use: No    OB History    Grav Para Term Preterm Abortions TAB SAB Ect Mult Living                  Review of Systems  Constitutional: Negative.   HENT: Positive for congestion, rhinorrhea, postnasal drip and sinus pressure. Negative for sore throat.   Respiratory: Positive for cough.   Gastrointestinal: Positive for nausea, vomiting and diarrhea.  Neurological: Positive for headaches.    Allergies  Chocolate and Peanut-containing drug products  Home Medications   Current Outpatient Rx  Name  Route  Sig  Dispense  Refill  . CORICIDIN HBP FLU PO   Oral   Take by mouth.         Marland Kitchen HYDROCHLOROTHIAZIDE 25 MG PO TABS   Oral   Take 1 tablet (25 mg total) by mouth daily.   30 tablet   0   . LEVOFLOXACIN 500 MG PO TABS   Oral   Take 1 tablet (500 mg total) by mouth daily.   7 tablet   0   . ONDANSETRON HCL 4 MG PO TABS   Oral   Take 1 tablet (4 mg total) by mouth every 6 (six) hours. As needed for n/v.   8 tablet   0   . PROMETHAZINE HCL 25 MG PO TABS   Oral   Take 1 tablet (25 mg total) by mouth every 6 (six) hours as needed for nausea.   15 tablet   0     BP 152/104  Pulse 80  Temp 98.9 F (37.2 C) (Oral)  Resp 22  SpO2 98%  LMP 10/06/2012  Physical Exam  Nursing note and vitals reviewed. Constitutional: She is oriented to person, place, and time. She appears well-developed and well-nourished.  HENT:  Head: Normocephalic.  Right Ear: External ear normal.  Left Ear: External ear normal.  Mouth/Throat: Oropharynx is clear and moist.  Eyes: Conjunctivae normal are normal. Pupils are equal, round, and reactive to light.  Neck: Normal range of motion. Neck supple.  Cardiovascular: Normal rate, normal heart sounds and intact distal pulses.   Pulmonary/Chest: Effort normal. She has rhonchi.  Abdominal: Soft. Bowel sounds are normal. She exhibits no distension and no mass. There is no tenderness. There is no rebound and no guarding.  Neurological: She is alert and oriented to person,  place, and time.  Skin: Skin is warm and dry.    ED Course  Procedures (including critical care time)   Labs Reviewed  POCT RAPID STREP A ([facility] ONLY)   Dg Chest 2 View  10/31/2012  *RADIOLOGY REPORT*  Clinical Data: Cough and fever  CHEST - 2 VIEW  Comparison: April 25, 2006.  Findings:  Lungs clear.  Heart size and pulmonary vascularity are normal.  No adenopathy.  No bone lesions.  IMPRESSION: Lungs clear.   Original Report Authenticated By: Bretta Bang, M.D.      1. Bronchitis due to tobacco use   2. Gastroenteritis, acute       MDM  X-rays reviewed and report per radiologist.         Linna Hoff, MD 10/31/12 1200

## 2012-11-22 DIAGNOSIS — H8109 Meniere's disease, unspecified ear: Secondary | ICD-10-CM

## 2012-11-22 HISTORY — DX: Meniere's disease, unspecified ear: H81.09

## 2013-02-01 ENCOUNTER — Encounter (HOSPITAL_COMMUNITY): Payer: Self-pay

## 2013-02-01 ENCOUNTER — Emergency Department (HOSPITAL_COMMUNITY)
Admission: EM | Admit: 2013-02-01 | Discharge: 2013-02-01 | Disposition: A | Discharge status: Home / Self Care | Payer: Medicaid Other | Source: Home / Self Care | Attending: Emergency Medicine | Admitting: Emergency Medicine

## 2013-02-01 DIAGNOSIS — M5431 Sciatica, right side: Secondary | ICD-10-CM

## 2013-02-01 DIAGNOSIS — I10 Essential (primary) hypertension: Secondary | ICD-10-CM

## 2013-02-01 LAB — POCT I-STAT, CHEM 8
BUN: 7 mg/dL (ref 6–23)
Potassium: 3.8 mEq/L (ref 3.5–5.1)
Sodium: 139 mEq/L (ref 135–145)
TCO2: 28 mmol/L (ref 0–100)

## 2013-02-01 MED ORDER — KETOROLAC TROMETHAMINE 60 MG/2ML IM SOLN
INTRAMUSCULAR | Status: AC
  Filled 2013-02-01: qty 2

## 2013-02-01 MED ORDER — METHYLPREDNISOLONE ACETATE 80 MG/ML IJ SUSP
INTRAMUSCULAR | Status: AC
  Filled 2013-02-01: qty 1

## 2013-02-01 MED ORDER — PREDNISONE 20 MG PO TABS: 20.0000 mg | ORAL_TABLET | Freq: Two times a day (BID) | ORAL | Status: DC

## 2013-02-01 MED ORDER — METHYLPREDNISOLONE ACETATE 80 MG/ML IJ SUSP
80.0000 mg | Freq: Once | INTRAMUSCULAR | Status: AC
  Administered 2013-02-01: 80 mg via INTRAMUSCULAR

## 2013-02-01 MED ORDER — KETOROLAC TROMETHAMINE 60 MG/2ML IM SOLN
60.0000 mg | Freq: Once | INTRAMUSCULAR | Status: AC
  Administered 2013-02-01: 60 mg via INTRAMUSCULAR

## 2013-02-01 MED ORDER — HYDROCHLOROTHIAZIDE 25 MG PO TABS: 25.0000 mg | ORAL_TABLET | Freq: Every day | ORAL | Status: DC

## 2013-02-01 MED ORDER — HYDROCODONE-ACETAMINOPHEN 5-325 MG PO TABS: ORAL_TABLET | ORAL | Status: DC

## 2013-02-01 MED ORDER — MELOXICAM 15 MG PO TABS: 15.0000 mg | ORAL_TABLET | Freq: Every day | ORAL | Status: DC

## 2013-02-01 NOTE — ED Notes (Signed)
C/o 3 month duration of pain in her right leg and foot. States she fell a few days ago, and then re injured her leg. C/o her Rx did not relieve her pain , so she doubled it up , causing her to be nauseated and vomit . Was reportedly unable to get an appt w [facility] , and was instructed to come back to [facility]

## 2013-02-01 NOTE — ED Notes (Signed)
States her leg "gives out on her"

## 2013-02-01 NOTE — ED Provider Notes (Signed)
Chief Complaint:   Chief Complaint  Patient presents with  . Leg Pain    History of Present Illness:   Debbie Jordan is a 42 year old female who presents today for right leg pain and refill on blood pressure medicine. The patient had a one-year history of pain in the right leg beginning in the hip and radiating down to the ankle. The pain is constant and rated 10 over 10 in intensity. It's a gnawing ache. She's had some numbness and tingling in the foot and weakness in the legs such that she fell several days ago and reinjured her leg. She doubled up on her anti-inflammatory and this caused her to be nauseated and vomit. The pain is worse with walking and weightbearing and not better with anything in particular. She denies any swelling of the leg. She denies any fever or chills. She's had no weight loss. She denies any back pain, although as noted below her back is painful with movement. There is no incontinence of urine or stool, urinary retention, dysuria, frequency, hematuria, or saddle anesthesia. She denies any abdominal pain. She was here her in April of 2013 for the same thing. She was given anti-inflammatories which did seem to help for a while. She notes also that her left arm tingles at times but denies any neck pain.  She's also here for refill on her blood pressure medication. She's been out of her blood pressure meds for years. She states she's and taking a diuretic plus another pill, but our records only show a record of her taking a diuretic. She denies any medication side effects. There's been no headache, dizziness, lightheadedness, blurry vision, chest pain, tightness, pressure, palpitations, syncope, shortness of breath, or ankle edema.  Review of Systems:  Other than noted above, the patient denies any of the following symptoms: Systemic:  No fever, chills, severe fatigue, or unexplained weight loss. GI:  No abdominal pain, nausea, vomiting, diarrhea, constipation, incontinence of  bowel, or blood in stool. GU:  No dysuria, frequency, urgency, or hematuria. No incontinence of urine or difficulty urinating.  M-S:  No neck pain, joint pain, arthritis, or myalgias. Neuro:  No paresthesias, saddle anesthesia, muscular weakness, or progressive neurological deficit.  PMFSH:  Past medical history, family history, social history, meds, and allergies were reviewed. Specifically, there is no history of cancer, major trauma, osteoporosis, immunosuppression, HIV, or IV or injection drug use. She has no medication allergies. She is supposed to be on a blood pressure pill, but she's been out of it for months. She's had high blood pressure for years. She smokes a pack of cigarettes a day.  Physical Exam:   Vital signs:  BP 162/98  Pulse 64  Temp(Src) 98 F (36.7 C) (Oral)  SpO2 99% General:  Alert, oriented, in no distress. Abdomen:  Soft, non-tender.  No organomegaly or mass.  No pulsatile midline abdominal mass or bruit. Back:  There was tenderness to palpation in the lower back just above the level of the sacroiliac joints bilaterally. The back has limited range of motion with 60 of flexion, 10 of lateral bending, 20 of side bending, and 20 of rotation with pain straight leg raising was negative. Neuro:  Normal muscle strength, sensations and DTRs. Extremities: Pedal pulses were full, there was no edema. Skin:  Clear, warm and dry.  No rash.  Labs:   Results for orders placed during the hospital encounter of 02/01/13  POCT I-STAT, CHEM 8      Result Value Range  Sodium 139  135 - 145 mEq/L   Potassium 3.8  3.5 - 5.1 mEq/L   Chloride 102  96 - 112 mEq/L   BUN 7  6 - 23 mg/dL   Creatinine, Ser 9.14  0.50 - 1.10 mg/dL   Glucose, Bld 71  70 - 99 mg/dL   Calcium, Ion 7.82  9.56 - 1.23 mmol/L   TCO2 28  0 - 100 mmol/L   Hemoglobin 15.3 (*) 12.0 - 15.0 g/dL   HCT 21.3  08.6 - 57.8 %     Assessment:  The primary encounter diagnosis was Sciatica, right. Diagnoses of  Hypertension and Hip pain, right were also pertinent to this visit.  I think her leg pain is due to sciatica. Her straight leg raising was negative, but she has pain in the entire leg, this seems to be concentrated around the hip, so she may have some bursitis or tendinitis there as well. I think she needs followup with an orthopedist. She also needs to get started back with her primary care physician and suggested the adult care clinic here. I did refill her hydrochlorothiazide as well.  Plan:   1.  The following meds were prescribed:   Discharge Medication List as of 02/01/2013  1:37 PM    START taking these medications   Details  !! hydrochlorothiazide (HYDRODIURIL) 25 MG tablet Take 1 tablet (25 mg total) by mouth daily., Starting 02/01/2013, Until Discontinued, Normal    HYDROcodone-acetaminophen (NORCO/VICODIN) 5-325 MG per tablet 1 to 2 tabs every 4 to 6 hours as needed for pain., Print    meloxicam (MOBIC) 15 MG tablet Take 1 tablet (15 mg total) by mouth daily., Starting 02/01/2013, Until Discontinued, Normal    predniSONE (DELTASONE) 20 MG tablet Take 1 tablet (20 mg total) by mouth 2 (two) times daily., Starting 02/01/2013, Until Discontinued, Normal     !! - Potential duplicate medications found. Please discuss with provider.     2.  The patient was instructed in symptomatic care and handouts were given. 3.  The patient was told to return if becoming worse in any way, if no better in 2 weeks, and given some red flag symptoms including worsening of the pain, fever, or neurological symptoms that would indicate earlier return. 4.  The patient was encouraged to try to be as active as possible and given some exercises to do followed by moist heat.    Reuben Likes, MD 02/01/13 [phone]

## 2013-06-15 ENCOUNTER — Emergency Department (HOSPITAL_COMMUNITY)
Admission: EM | Admit: 2013-06-15 | Discharge: 2013-06-16 | Disposition: A | Discharge status: Home / Self Care | Payer: Medicaid Other | Attending: Emergency Medicine | Admitting: Emergency Medicine

## 2013-06-15 DIAGNOSIS — R519 Headache, unspecified: Secondary | ICD-10-CM

## 2013-06-15 DIAGNOSIS — H53149 Visual discomfort, unspecified: Secondary | ICD-10-CM | POA: Insufficient documentation

## 2013-06-15 DIAGNOSIS — R110 Nausea: Secondary | ICD-10-CM | POA: Insufficient documentation

## 2013-06-15 DIAGNOSIS — R109 Unspecified abdominal pain: Secondary | ICD-10-CM

## 2013-06-15 DIAGNOSIS — Z3202 Encounter for pregnancy test, result negative: Secondary | ICD-10-CM | POA: Insufficient documentation

## 2013-06-15 DIAGNOSIS — K5900 Constipation, unspecified: Secondary | ICD-10-CM | POA: Insufficient documentation

## 2013-06-15 DIAGNOSIS — Z79899 Other long term (current) drug therapy: Secondary | ICD-10-CM | POA: Insufficient documentation

## 2013-06-15 DIAGNOSIS — F17200 Nicotine dependence, unspecified, uncomplicated: Secondary | ICD-10-CM | POA: Insufficient documentation

## 2013-06-15 DIAGNOSIS — R569 Unspecified convulsions: Secondary | ICD-10-CM | POA: Insufficient documentation

## 2013-06-15 DIAGNOSIS — R51 Headache: Secondary | ICD-10-CM | POA: Insufficient documentation

## 2013-06-15 DIAGNOSIS — I10 Essential (primary) hypertension: Secondary | ICD-10-CM | POA: Insufficient documentation

## 2013-06-15 NOTE — ED Provider Notes (Signed)
CSN: [id number]     Arrival date & time 06/15/13  2216 History     First MD Initiated Contact with Patient 06/15/13 2219     Chief Complaint  Patient presents with  . Abdominal Pain   (Consider location/radiation/quality/duration/timing/severity/associated sxs/prior Treatment) HPI Comments: Patient is a 42 year old female past history significant for hypertension presenting to the emergency department for multiple complaints. Patient states an hour ago she was over a friend's house and felt very lightheaded had states she lost consciousness and is claiming she had a seizure but has no known seizure history and denies bladder incontinence. Patient is unsure with her she lost bladder control. Patient is now complaining of gradual onset HA and lower abdominal pain that are both now severe 10/10 sharp pain. Headache is worsened with light with no alleviating factors while abdominal pain has no alleviating or aggravating factors. LMP two weeks ago.    Past Medical History  Diagnosis Date  . Hypertension    Past Surgical History  Procedure Laterality Date  . Appendectomy    . Cesarean section w/btl     No family history on file. History  Substance Use Topics  . Smoking status: Current Every Day Smoker  . Smokeless tobacco: Not on file  . Alcohol Use: No   OB History   Grav Para Term Preterm Abortions TAB SAB Ect Mult Living                 Review of Systems  Constitutional: Negative for fever and chills.  Eyes: Positive for photophobia.  Respiratory: Negative for shortness of breath.   Cardiovascular: Negative for chest pain.  Gastrointestinal: Positive for nausea, abdominal pain and constipation. Negative for diarrhea.  Endocrine: Negative.   Genitourinary: Negative.   Musculoskeletal: Negative.   Skin: Negative.   Neurological: Positive for headaches.    Allergies  Chocolate and Peanut-containing drug products  Home Medications   Current Outpatient Rx  Name  Route   Sig  Dispense  Refill  . hydrochlorothiazide (HYDRODIURIL) 25 MG tablet   Oral   Take 1 tablet (25 mg total) by mouth daily.   30 tablet   2    BP 100/61  Pulse 77  Temp(Src) 98.2 F (36.8 C) (Oral)  Resp 20  SpO2 97%  LMP 05/31/2013 Physical Exam  Constitutional: She is oriented to person, place, and time. She appears well-developed and well-nourished.  HENT:  Head: Normocephalic and atraumatic.  Mouth/Throat: Oropharynx is clear and moist.  Eyes: Conjunctivae and EOM are normal. Pupils are equal, round, and reactive to light.  Neck: Neck supple.  Cardiovascular: Normal rate, regular rhythm, normal heart sounds and intact distal pulses.   Pulmonary/Chest: Effort normal and breath sounds normal.  Abdominal: Soft. Bowel sounds are normal. There is no rigidity, no rebound, no guarding and no CVA tenderness.  No tenderness on distraction examination  Musculoskeletal: Normal range of motion. She exhibits no edema.  Neurological: She is alert and oriented to person, place, and time. No cranial nerve deficit.  Skin: Skin is warm and dry.    ED Course   Procedures (including critical care time)  Medications  sodium chloride 0.9 % bolus 1,000 mL (not administered)  diphenhydrAMINE (BENADRYL) injection 25 mg (25 mg Intravenous Given 06/16/13 0014)  metoCLOPramide (REGLAN) injection 10 mg (10 mg Intravenous Given 06/16/13 0014)  0.9 %  sodium chloride infusion ( Intravenous New Bag/Given 06/16/13 0014)    Labs Reviewed  CBC WITH DIFFERENTIAL - Abnormal; Notable for the  following:    HCT 35.3 (*)    RDW 16.4 (*)    Neutrophils Relative % 80 (*)    Neutro Abs 8.1 (*)    All other components within normal limits  URINALYSIS, ROUTINE W REFLEX MICROSCOPIC  COMPREHENSIVE METABOLIC PANEL  PREGNANCY, URINE   No results found. 1. Abdominal pain   2. Headache     MDM  Patient presenting with multiple complaints including headache and abdominal pain. No neurofocal deficits on  examination. Abdomen soft, non-tender on distraction exam, non-distended. Plan is to obtain labs and treat symptoms. Exam non-concerning for acute emergent cause. At this time do not feel any imaging is required. Patient d/w with Dr. Norlene Campbell, agrees with plan at this time.    1:14 AMHeadache improved after Benadryl and Reglan administration.    Patient will be signed out to Antony Madura, PA-C disposition pending lab results. Patient stable at time of shift change.    Jeannetta Ellis, PA-C 06/16/13 [phone]

## 2013-06-15 NOTE — ED Notes (Signed)
Per EMS pt c/o lower abdominal pain. Rates pain 10/10. Pt has no history of seizures but told EMS she think she may of had a seizure. Friend at home with pt told EMS pt blacked out for 2-3 mins. Pt is alert at this time, and states she is nausea, has headache and feels pressure behind both eyes.

## 2013-06-15 NOTE — ED Notes (Signed)
[id number]<AV> Expected date:<BR> Expected time:<BR> Means of arrival:<BR> Comments:<BR> EMS/41 yo syncope with abdominal pain

## 2013-06-16 LAB — COMPREHENSIVE METABOLIC PANEL
ALT: 6 U/L (ref 0–35)
AST: 11 U/L (ref 0–37)
Albumin: 3.3 g/dL — ABNORMAL LOW (ref 3.5–5.2)
Calcium: 8.7 mg/dL (ref 8.4–10.5)
GFR calc Af Amer: >90 mL/min (ref >90)
Glucose, Bld: 116 mg/dL — ABNORMAL HIGH (ref 70–99)
Potassium: 3.4 mEq/L — ABNORMAL LOW (ref 3.5–5.1)
Sodium: 136 mEq/L (ref 135–145)
Total Protein: 6.7 g/dL (ref 6.0–8.3)

## 2013-06-16 LAB — CBC WITH DIFFERENTIAL/PLATELET
Basophils Absolute: 0 10*3/uL (ref 0.0–0.1)
Basophils Relative: 0 % (ref 0–1)
Eosinophils Absolute: 0.1 10*3/uL (ref 0.0–0.7)
Eosinophils Relative: 1 % (ref 0–5)
MCH: 28.3 pg (ref 26.0–34.0)
MCHC: 34 g/dL (ref 30.0–36.0)
MCV: 83.3 fL (ref 78.0–100.0)
Neutrophils Relative %: 80 % — ABNORMAL HIGH (ref 43–77)
Platelets: 323 10*3/uL (ref 150–400)
RDW: 16.4 % — ABNORMAL HIGH (ref 11.5–15.5)

## 2013-06-16 LAB — URINALYSIS, ROUTINE W REFLEX MICROSCOPIC
Bilirubin Urine: NEGATIVE
Hgb urine dipstick: NEGATIVE
Nitrite: NEGATIVE
Protein, ur: NEGATIVE mg/dL
Urobilinogen, UA: 0.2 mg/dL (ref 0.0–1.0)

## 2013-06-16 MED ORDER — METOCLOPRAMIDE HCL 5 MG/ML IJ SOLN
10.0000 mg | Freq: Once | INTRAMUSCULAR | Status: AC
  Administered 2013-06-16: 10 mg via INTRAVENOUS
  Filled 2013-06-16: qty 2

## 2013-06-16 MED ORDER — SODIUM CHLORIDE 0.9 % IV SOLN
Freq: Once | INTRAVENOUS | Status: AC
  Administered 2013-06-16: via INTRAVENOUS

## 2013-06-16 MED ORDER — DIPHENHYDRAMINE HCL 50 MG/ML IJ SOLN
25.0000 mg | Freq: Once | INTRAMUSCULAR | Status: AC
  Administered 2013-06-16: 25 mg via INTRAVENOUS
  Filled 2013-06-16: qty 1

## 2013-06-16 MED ORDER — SODIUM CHLORIDE 0.9 % IV BOLUS (SEPSIS): 1000.0000 mL | Freq: Once | INTRAVENOUS | Status: DC

## 2013-06-16 NOTE — ED Provider Notes (Signed)
Medical screening examination/treatment/procedure(s) were performed by non-physician practitioner and as supervising physician I was immediately available for consultation/collaboration.  Olivia Mackie, MD 06/16/13 [phone]

## 2013-06-16 NOTE — ED Provider Notes (Signed)
Patient care assumed from [facility], PA-C at shift change with labs pending.  Labs without leukocytosis, anemia, or electrolyte imbalance. Liver and kidney function preserved. UA without evidence of infection. Urine pregnancy negative. Patient endorses improvement in symptoms with Reglan and Benadryl. She is asymptomatic at this time and hemodynamically stable throughout ED course. Patient appropriate for discharge with primary care followup. Resource guide provided. Indications for ED return discussed and patient agreeable to plan.  Results for orders placed during the hospital encounter of 06/15/13  URINALYSIS, ROUTINE W REFLEX MICROSCOPIC      Result Value Range   Color, Urine YELLOW  YELLOW   APPearance CLEAR  CLEAR   Specific Gravity, Urine 1.009  1.005 - 1.030   pH 6.0  5.0 - 8.0   Glucose, UA NEGATIVE  NEGATIVE mg/dL   Hgb urine dipstick NEGATIVE  NEGATIVE   Bilirubin Urine NEGATIVE  NEGATIVE   Ketones, ur NEGATIVE  NEGATIVE mg/dL   Protein, ur NEGATIVE  NEGATIVE mg/dL   Urobilinogen, UA 0.2  0.0 - 1.0 mg/dL   Nitrite NEGATIVE  NEGATIVE   Leukocytes, UA NEGATIVE  NEGATIVE  CBC WITH DIFFERENTIAL      Result Value Range   WBC 10.1  4.0 - 10.5 K/uL   RBC 4.24  3.87 - 5.11 MIL/uL   Hemoglobin 12.0  12.0 - 15.0 g/dL   HCT 16.1 (*) 09.6 - 04.5 %   MCV 83.3  78.0 - 100.0 fL   MCH 28.3  26.0 - 34.0 pg   MCHC 34.0  30.0 - 36.0 g/dL   RDW 40.9 (*) 81.1 - 91.4 %   Platelets 323  150 - 400 K/uL   Neutrophils Relative % 80 (*) 43 - 77 %   Neutro Abs 8.1 (*) 1.7 - 7.7 K/uL   Lymphocytes Relative 12  12 - 46 %   Lymphs Abs 1.2  0.7 - 4.0 K/uL   Monocytes Relative 7  3 - 12 %   Monocytes Absolute 0.7  0.1 - 1.0 K/uL   Eosinophils Relative 1  0 - 5 %   Eosinophils Absolute 0.1  0.0 - 0.7 K/uL   Basophils Relative 0  0 - 1 %   Basophils Absolute 0.0  0.0 - 0.1 K/uL  COMPREHENSIVE METABOLIC PANEL      Result Value Range   Sodium 136  135 - 145 mEq/L   Potassium 3.4 (*) 3.5 - 5.1  mEq/L   Chloride 99  96 - 112 mEq/L   CO2 28  19 - 32 mEq/L   Glucose, Bld 116 (*) 70 - 99 mg/dL   BUN 11  6 - 23 mg/dL   Creatinine, Ser 7.82  0.50 - 1.10 mg/dL   Calcium 8.7  8.4 - 95.6 mg/dL   Total Protein 6.7  6.0 - 8.3 g/dL   Albumin 3.3 (*) 3.5 - 5.2 g/dL   AST 11  0 - 37 U/L   ALT 6  0 - 35 U/L   Alkaline Phosphatase 73  39 - 117 U/L   Total Bilirubin 0.2 (*) 0.3 - 1.2 mg/dL   GFR calc non Af Amer >90  >90 mL/min   GFR calc Af Amer >90  >90 mL/min  PREGNANCY, URINE      Result Value Range   Preg Test, Ur NEGATIVE  NEGATIVE    Antony Madura, PA-C 06/16/13 0215

## 2013-06-16 NOTE — ED Provider Notes (Signed)
Medical screening examination/treatment/procedure(s) were performed by non-physician practitioner and as supervising physician I was immediately available for consultation/collaboration.  Jove Beyl M Aster Screws, MD 06/16/13 0726 

## 2013-07-08 IMAGING — CT CT HEAD W/O CM
1 of 2 series · 13 of 30 positions shown, 17 images · non-contrast
Comparison: MRI brain dated 02/21/2011

CLINICAL DATA: Headache, nausea, dizziness, hypertension

CT HEAD WITHOUT CONTRAST
TECHNIQUE: Contiguous axial images were obtained from the base of
the skull through the vertex without contrast.

[Series 2: brain · axial · 0.47mm/px · z∈[-96,+25]mm · 13 of 28 slices shown, 17 images]
[im 2/28  brain]
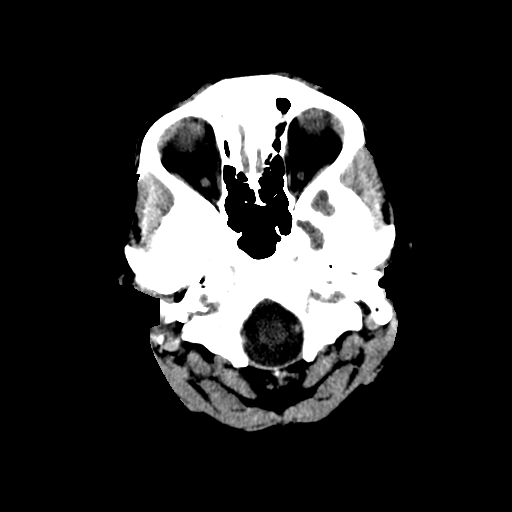
[im 2/28  bone]
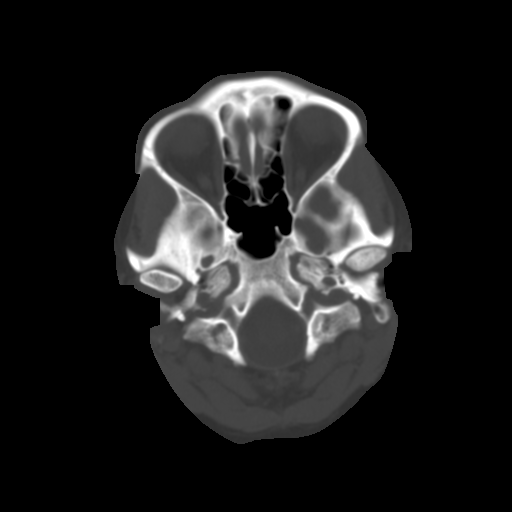
[im 4/28  brain]
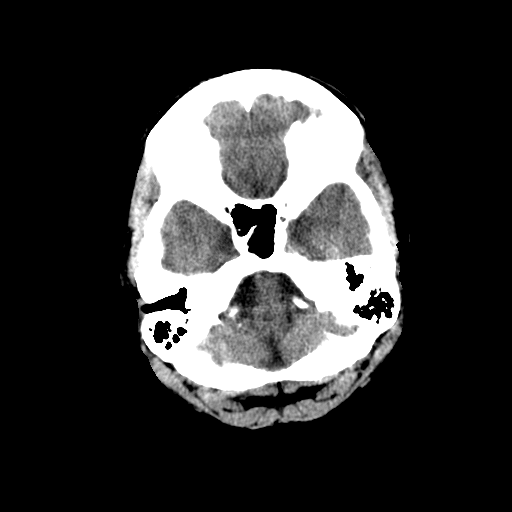
[im 6/28  brain]
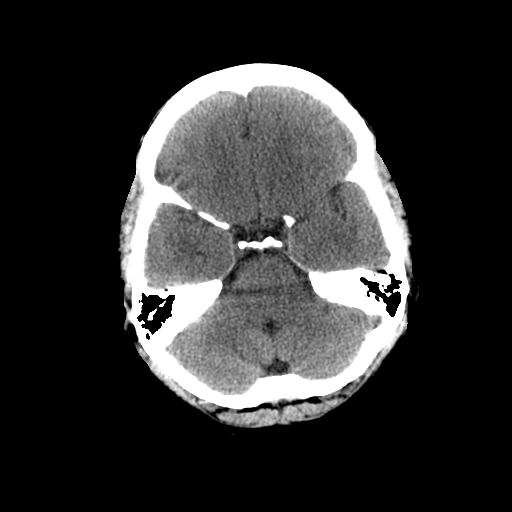
[im 8/28  brain]
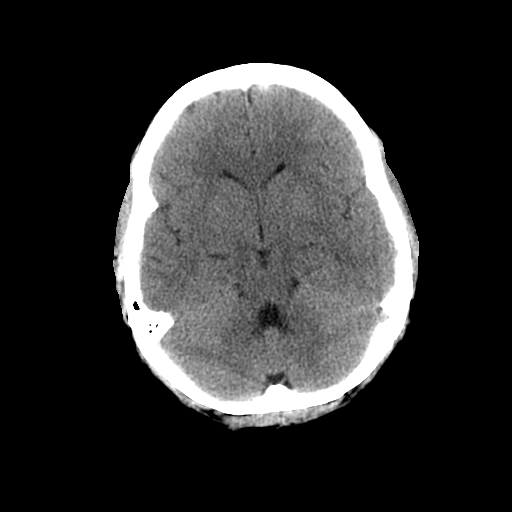
[im 10/28  brain]
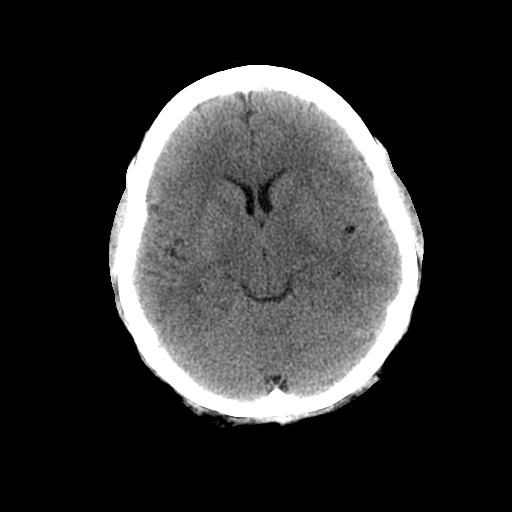
[im 10/28  bone]
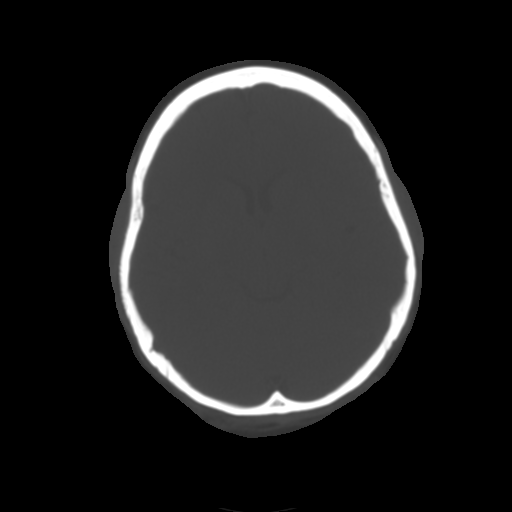
[im 12/28  brain]
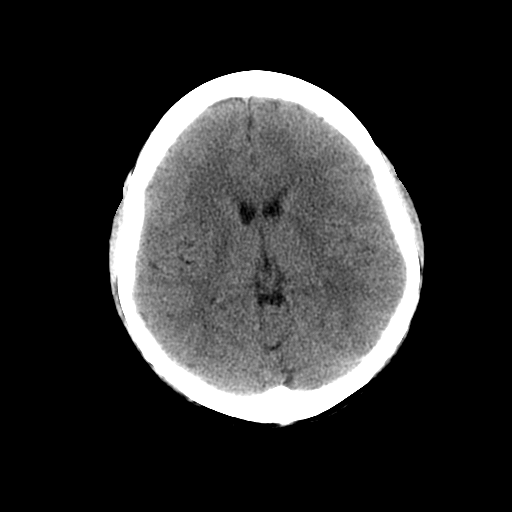
[im 14/28  brain]
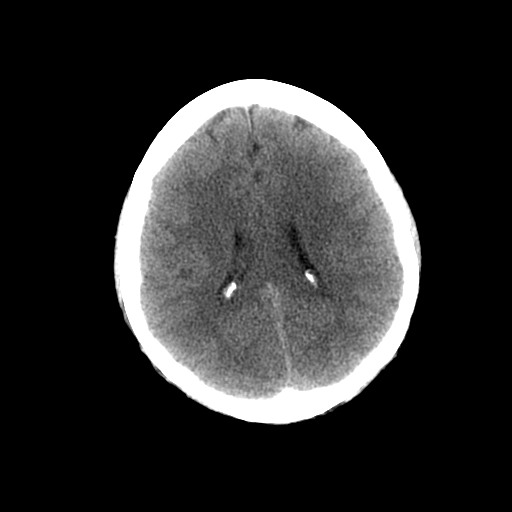
[im 16/28  brain]
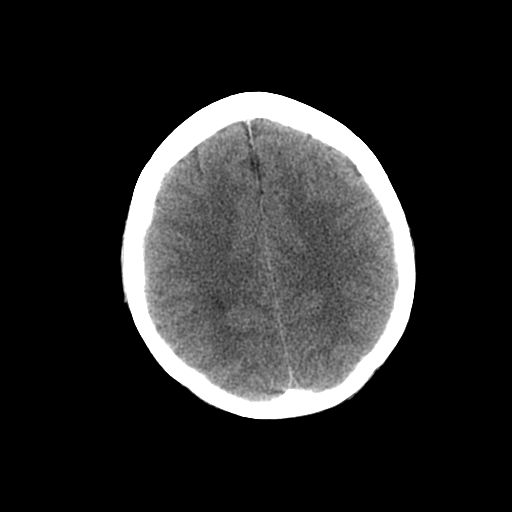
[im 18/28  brain]
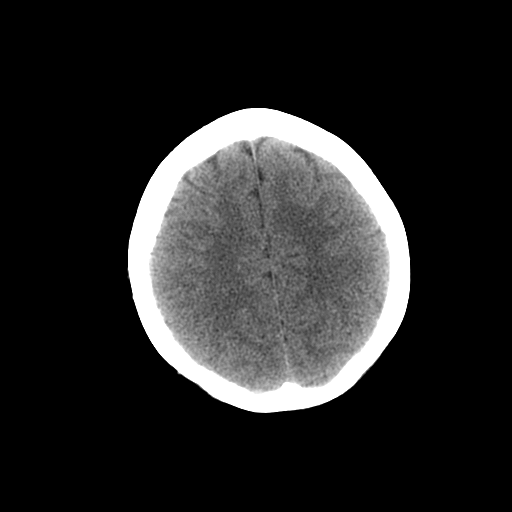
[im 18/28  bone]
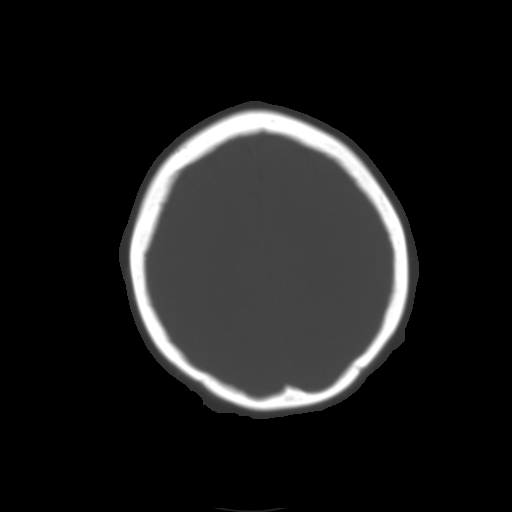
[im 20/28  brain]
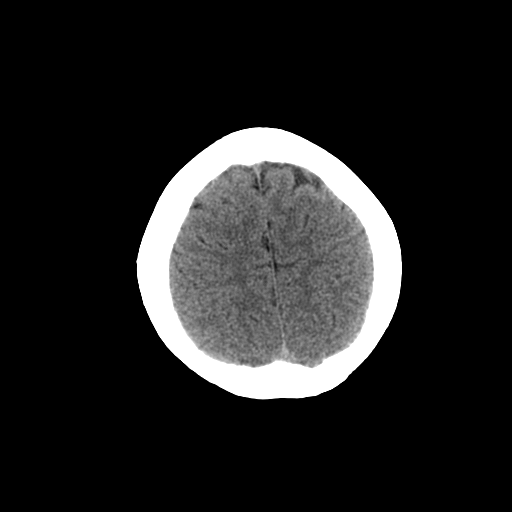
[im 22/28  brain]
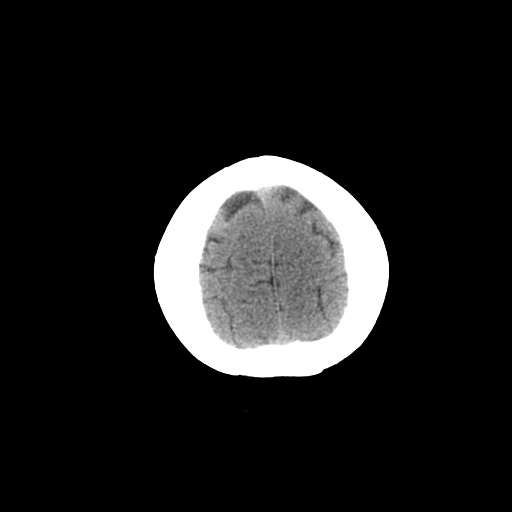
[im 24/28  brain]
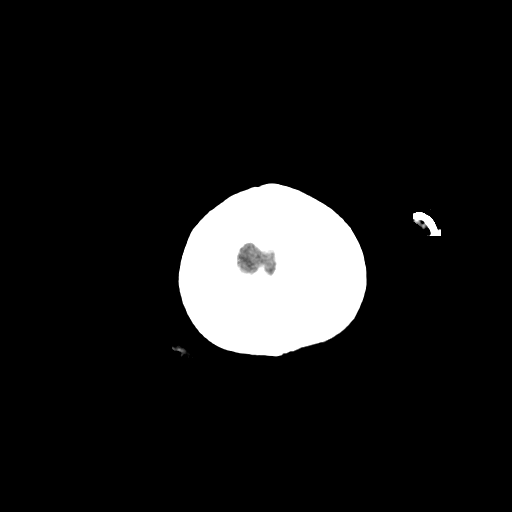
[im 26/28  brain]
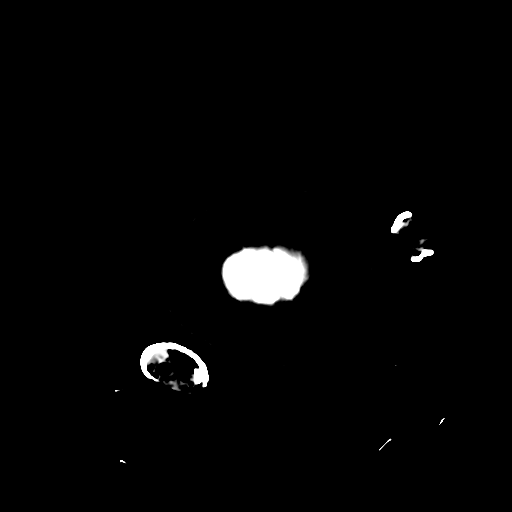
[im 26/28  bone]
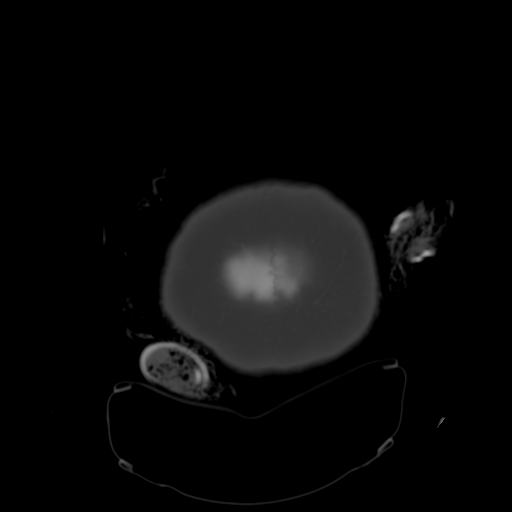

[13 of 30 positions shown; findings below may reference images not displayed]

FINDINGS: No evidence of parenchymal hemorrhage or extra-axial
fluid collection. No mass lesion, mass effect, or midline shift.

No CT evidence of acute infarction.

Cerebral volume is age appropriate.  No ventriculomegaly.

The visualized paranasal sinuses are essentially clear. The mastoid
air cells are unopacified.

No evidence of calvarial fracture.
IMPRESSION: No evidence of acute intracranial abnormality.

## 2013-07-15 ENCOUNTER — Emergency Department (HOSPITAL_COMMUNITY)
Admission: EM | Admit: 2013-07-15 | Discharge: 2013-07-15 | Disposition: A | Discharge status: Home / Self Care | Payer: Medicaid Other | Attending: Emergency Medicine | Admitting: Emergency Medicine

## 2013-07-15 ENCOUNTER — Encounter (HOSPITAL_COMMUNITY): Payer: Self-pay | Admitting: *Deleted

## 2013-07-15 DIAGNOSIS — Z3202 Encounter for pregnancy test, result negative: Secondary | ICD-10-CM | POA: Insufficient documentation

## 2013-07-15 DIAGNOSIS — H81399 Other peripheral vertigo, unspecified ear: Secondary | ICD-10-CM | POA: Insufficient documentation

## 2013-07-15 DIAGNOSIS — I10 Essential (primary) hypertension: Secondary | ICD-10-CM | POA: Insufficient documentation

## 2013-07-15 DIAGNOSIS — F17200 Nicotine dependence, unspecified, uncomplicated: Secondary | ICD-10-CM | POA: Insufficient documentation

## 2013-07-15 DIAGNOSIS — Z79899 Other long term (current) drug therapy: Secondary | ICD-10-CM | POA: Insufficient documentation

## 2013-07-15 DIAGNOSIS — R42 Dizziness and giddiness: Secondary | ICD-10-CM | POA: Insufficient documentation

## 2013-07-15 LAB — POCT I-STAT, CHEM 8
Chloride: 107 mEq/L (ref 96–112)
Creatinine, Ser: 0.8 mg/dL (ref 0.50–1.10)
HCT: 39 % (ref 36.0–46.0)
Hemoglobin: 13.3 g/dL (ref 12.0–15.0)
Potassium: 3.4 mEq/L — ABNORMAL LOW (ref 3.5–5.1)
Sodium: 142 mEq/L (ref 135–145)

## 2013-07-15 LAB — URINALYSIS, ROUTINE W REFLEX MICROSCOPIC
Hgb urine dipstick: NEGATIVE
Nitrite: NEGATIVE
Specific Gravity, Urine: 1.021 (ref 1.005–1.030)
Urobilinogen, UA: 0.2 mg/dL (ref 0.0–1.0)
pH: 7.5 (ref 5.0–8.0)

## 2013-07-15 MED ORDER — ONDANSETRON 4 MG PO TBDP
4.0000 mg | ORAL_TABLET | Freq: Once | ORAL | Status: AC
  Administered 2013-07-15: 4 mg via ORAL
  Filled 2013-07-15: qty 1

## 2013-07-15 MED ORDER — MECLIZINE HCL 25 MG PO TABS
50.0000 mg | ORAL_TABLET | Freq: Once | ORAL | Status: AC
  Administered 2013-07-15: 50 mg via ORAL
  Filled 2013-07-15: qty 2

## 2013-07-15 MED ORDER — ONDANSETRON HCL 4 MG/2ML IJ SOLN: 4.0000 mg | Freq: Once | INTRAMUSCULAR | Status: DC

## 2013-07-15 MED ORDER — ONDANSETRON HCL 4 MG/2ML IJ SOLN
INTRAMUSCULAR | Status: AC
  Filled 2013-07-15: qty 2

## 2013-07-15 MED ORDER — MECLIZINE HCL 50 MG PO TABS: 50.0000 mg | ORAL_TABLET | Freq: Three times a day (TID) | ORAL | Status: DC | PRN

## 2013-07-15 MED ORDER — ONDANSETRON 4 MG PO TBDP: ORAL_TABLET | ORAL | Status: DC

## 2013-07-15 NOTE — ED Notes (Signed)
Per ptar, pt from home, reports onset of n/v and dizziness since 0200 this morning. Hx of HTN, has not had antihypertensive meds in 10 days. bp 138/79, pulse 78, respirations 18, cbg 74

## 2013-07-15 NOTE — ED Notes (Signed)
Pt actively vomiting after swallowing meclizine. MD aware. Will re-dose in 10 minutes

## 2013-07-15 NOTE — ED Provider Notes (Signed)
CSN: [id number]     Arrival date & time 07/15/13  1014 History     First MD Initiated Contact with Patient 07/15/13 1016     Chief Complaint  Patient presents with  . Emesis  . Dizziness   (Consider location/radiation/quality/duration/timing/severity/associated sxs/prior Treatment) HPI Pt with intermittent vertigo for the past few years, had 2 episodes yesterday and states she got up at 0200 this AM and "spinning" began again. Associated with nausea and vomiting. Worse when turing her head and sitting up. +ringing in R ear. No fever or chills. No focal weakness. Ambulatory.  Past Medical History  Diagnosis Date  . Hypertension    Past Surgical History  Procedure Laterality Date  . Appendectomy    . Cesarean section w/btl     No family history on file. History  Substance Use Topics  . Smoking status: Current Every Day Smoker  . Smokeless tobacco: Not on file  . Alcohol Use: No   OB History   Grav Para Term Preterm Abortions TAB SAB Ect Mult Living                 Review of Systems  Constitutional: Negative for fever and chills.  Respiratory: Negative for shortness of breath.   Cardiovascular: Negative for chest pain.  Gastrointestinal: Positive for nausea and vomiting. Negative for abdominal pain.  Musculoskeletal: Negative for back pain.  Neurological: Positive for dizziness. Negative for syncope, weakness, light-headedness and headaches.  All other systems reviewed and are negative.    Allergies  Chocolate and Peanut-containing drug products  Home Medications   Current Outpatient Rx  Name  Route  Sig  Dispense  Refill  . hydrochlorothiazide (HYDRODIURIL) 25 MG tablet   Oral   Take 1 tablet (25 mg total) by mouth daily.   30 tablet   2   . ibuprofen (ADVIL,MOTRIN) 200 MG tablet   Oral   Take 800 mg by mouth every 6 (six) hours as needed for pain.         . meclizine (ANTIVERT) 50 MG tablet   Oral   Take 1 tablet (50 mg total) by mouth 3 (three) times  daily as needed.   30 tablet   0   . ondansetron (ZOFRAN ODT) 4 MG disintegrating tablet      4mg  ODT q4 hours prn nausea/vomit   4 tablet   0    BP 120/76  Pulse 69  Temp(Src) 97.9 F (36.6 C) (Oral)  Resp 16  SpO2 100%  LMP 06/05/2013 Physical Exam  Nursing note and vitals reviewed. Constitutional: She is oriented to person, place, and time. She appears well-developed and well-nourished. No distress.  HENT:  Head: Normocephalic and atraumatic.  Mouth/Throat: Oropharynx is clear and moist.  Eyes: EOM are normal. Pupils are equal, round, and reactive to light.  Fatiguing horizontal nystagmus  Neck: Normal range of motion. Neck supple.  Cardiovascular: Normal rate and regular rhythm.   Pulmonary/Chest: Effort normal and breath sounds normal. No respiratory distress. She has no wheezes. She has no rales.  Abdominal: Soft. Bowel sounds are normal. She exhibits no distension and no mass. There is no tenderness. There is no rebound and no guarding.  Musculoskeletal: Normal range of motion. She exhibits no edema and no tenderness.  Neurological: She is alert and oriented to person, place, and time.  bl finger to nose intact, 5/5 motor in all ext, sensation intact  Skin: Skin is warm and dry. No rash noted. No erythema.  Psychiatric: She  has a normal mood and affect. Her behavior is normal.    ED Course   Procedures (including critical care time)  Labs Reviewed  POCT I-STAT, CHEM 8 - Abnormal; Notable for the following:    Potassium 3.4 (*)    All other components within normal limits  URINALYSIS, ROUTINE W REFLEX MICROSCOPIC  POCT PREGNANCY, URINE   No results found. 1. Peripheral vertigo, unspecified laterality     MDM  Pt states symptoms have completely resolved. Presentation consistent with peripheral vertigo. Will d/c home with Rx and ENT follow up. Return precautions given.   Loren Racer, MD 07/15/13 1339

## 2013-08-08 ENCOUNTER — Other Ambulatory Visit: Payer: Self-pay | Admitting: Otolaryngology

## 2013-08-08 DIAGNOSIS — H9190 Unspecified hearing loss, unspecified ear: Secondary | ICD-10-CM

## 2013-08-13 ENCOUNTER — Other Ambulatory Visit: Payer: Self-pay | Admitting: Physician Assistant

## 2013-08-13 DIAGNOSIS — N939 Abnormal uterine and vaginal bleeding, unspecified: Secondary | ICD-10-CM

## 2013-08-13 DIAGNOSIS — Z1231 Encounter for screening mammogram for malignant neoplasm of breast: Secondary | ICD-10-CM

## 2013-08-16 ENCOUNTER — Ambulatory Visit
Admission: RE | Admit: 2013-08-16 | Discharge: 2013-08-16 | Disposition: A | Discharge status: Home / Self Care | Payer: Medicaid Other | Source: Ambulatory Visit | Attending: Physician Assistant | Admitting: Physician Assistant

## 2013-08-16 ENCOUNTER — Ambulatory Visit
Admission: RE | Admit: 2013-08-16 | Discharge: 2013-08-16 | Disposition: A | Discharge status: Home / Self Care | Payer: Medicaid Other | Source: Ambulatory Visit | Attending: Otolaryngology | Admitting: Otolaryngology

## 2013-08-16 DIAGNOSIS — H9190 Unspecified hearing loss, unspecified ear: Secondary | ICD-10-CM

## 2013-08-16 DIAGNOSIS — N939 Abnormal uterine and vaginal bleeding, unspecified: Secondary | ICD-10-CM

## 2013-08-16 MED ORDER — GADOBENATE DIMEGLUMINE 529 MG/ML IV SOLN
20.0000 mL | Freq: Once | INTRAVENOUS | Status: AC | PRN
  Administered 2013-08-16: 20 mL via INTRAVENOUS

## 2013-08-22 ENCOUNTER — Encounter: Payer: Self-pay | Admitting: *Deleted

## 2013-08-27 ENCOUNTER — Ambulatory Visit: Payer: Medicaid Other

## 2013-09-26 ENCOUNTER — Encounter: Payer: Medicaid Other | Admitting: Obstetrics & Gynecology

## 2014-01-23 IMAGING — CR DG CHEST 2V
3 series · 3 of 3 positions shown · non-contrast
Comparison: April 25, 2006.

CLINICAL DATA: Cough and fever

CHEST - 2 VIEW

[view not recorded (1 of 3)]
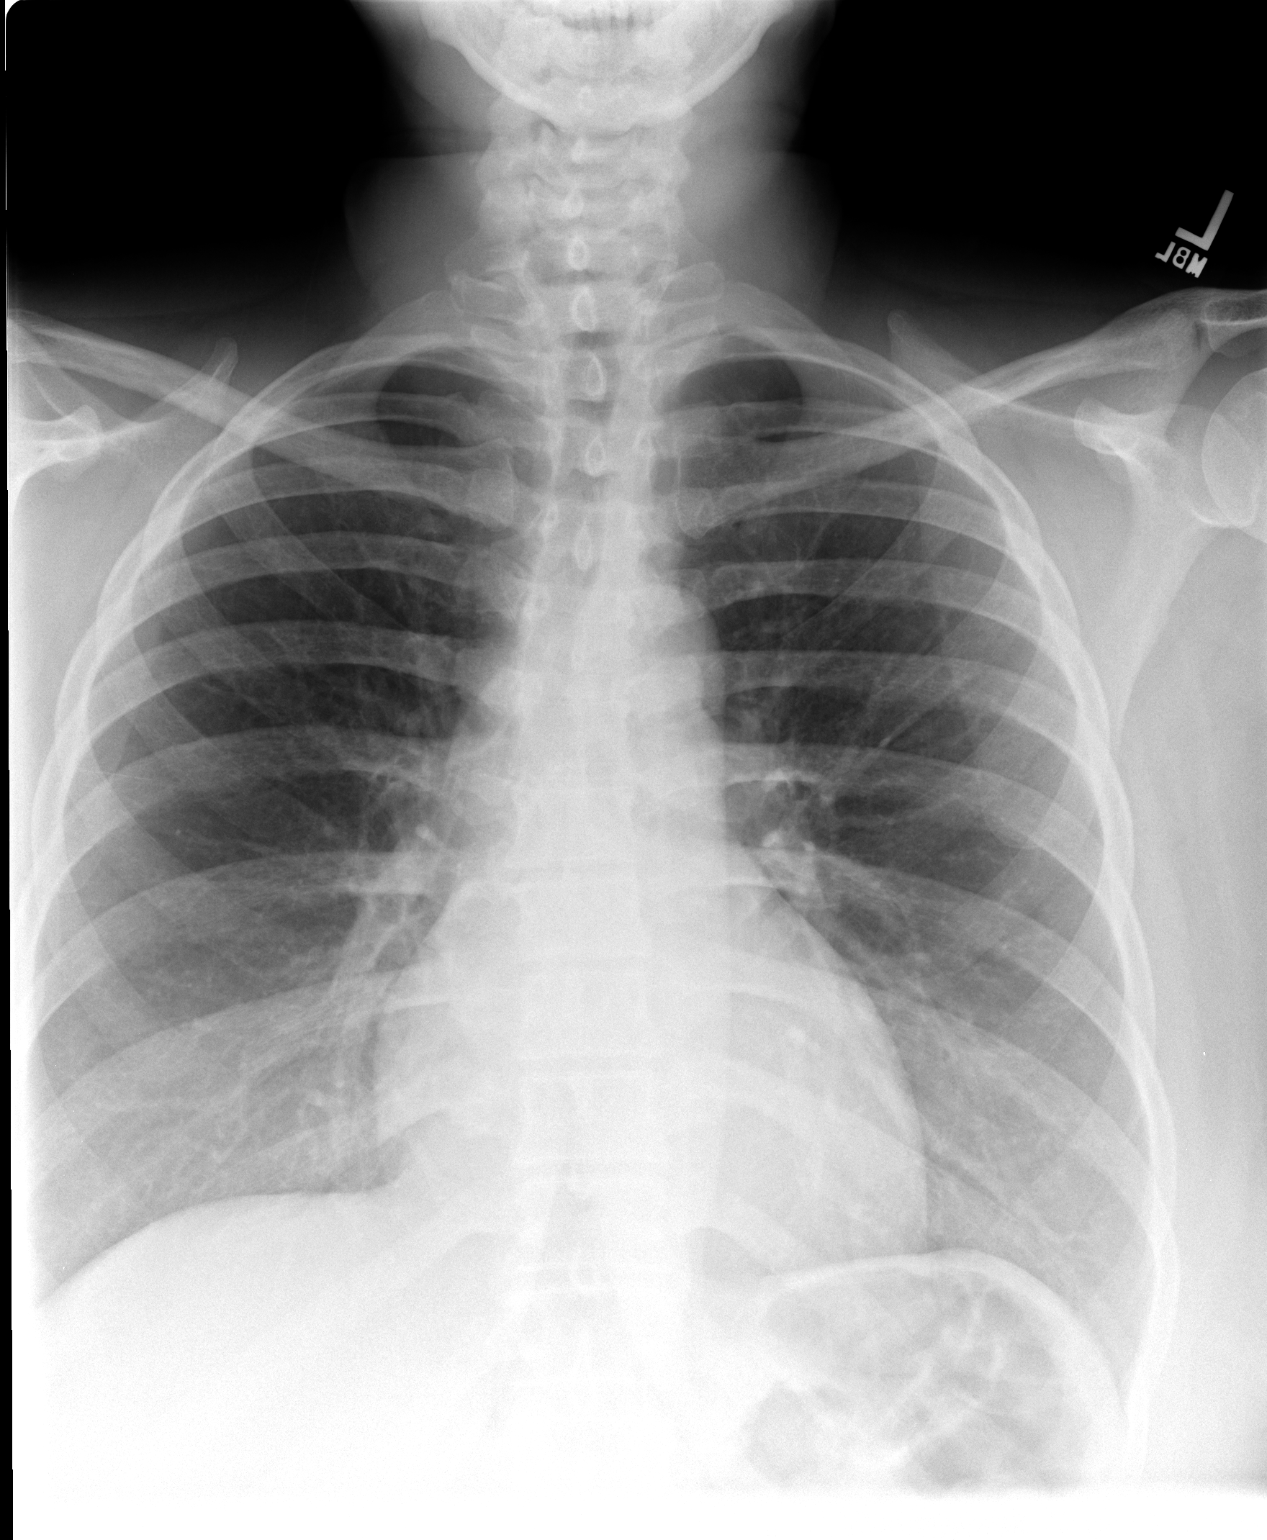

[view not recorded (2 of 3)]
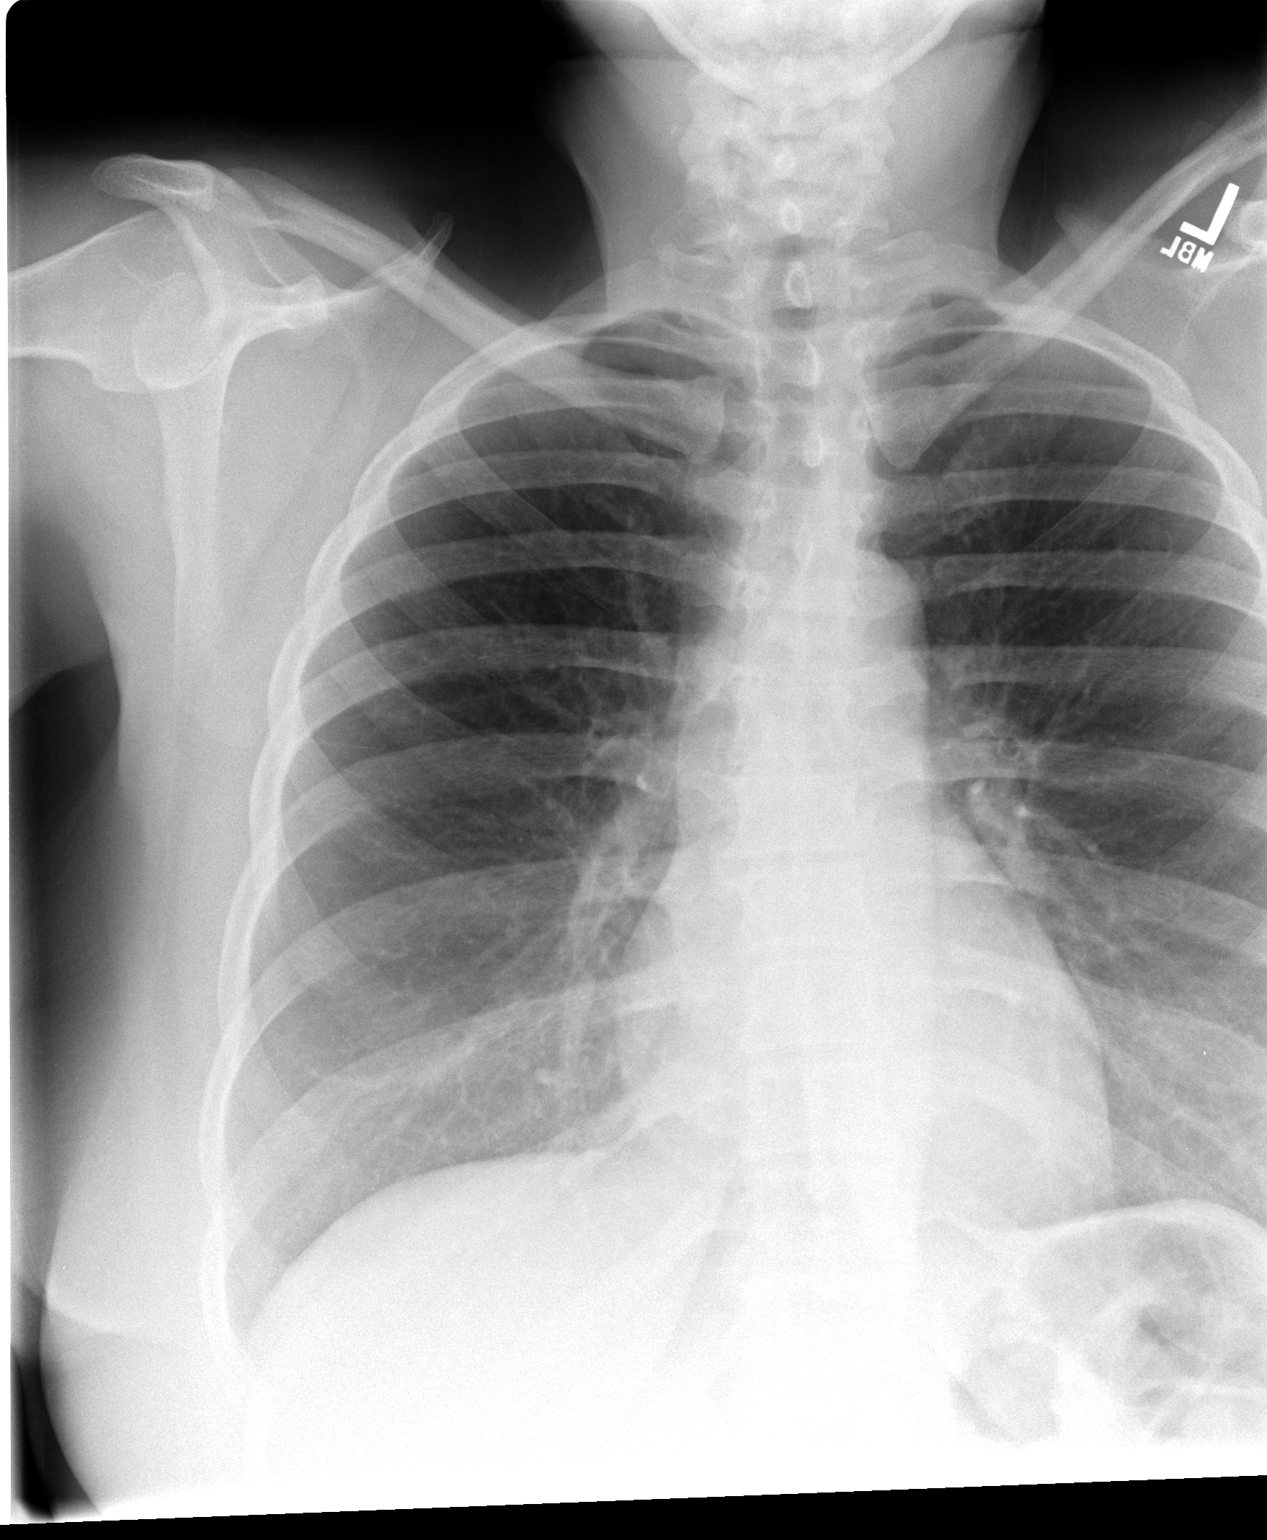

[view not recorded (3 of 3)]
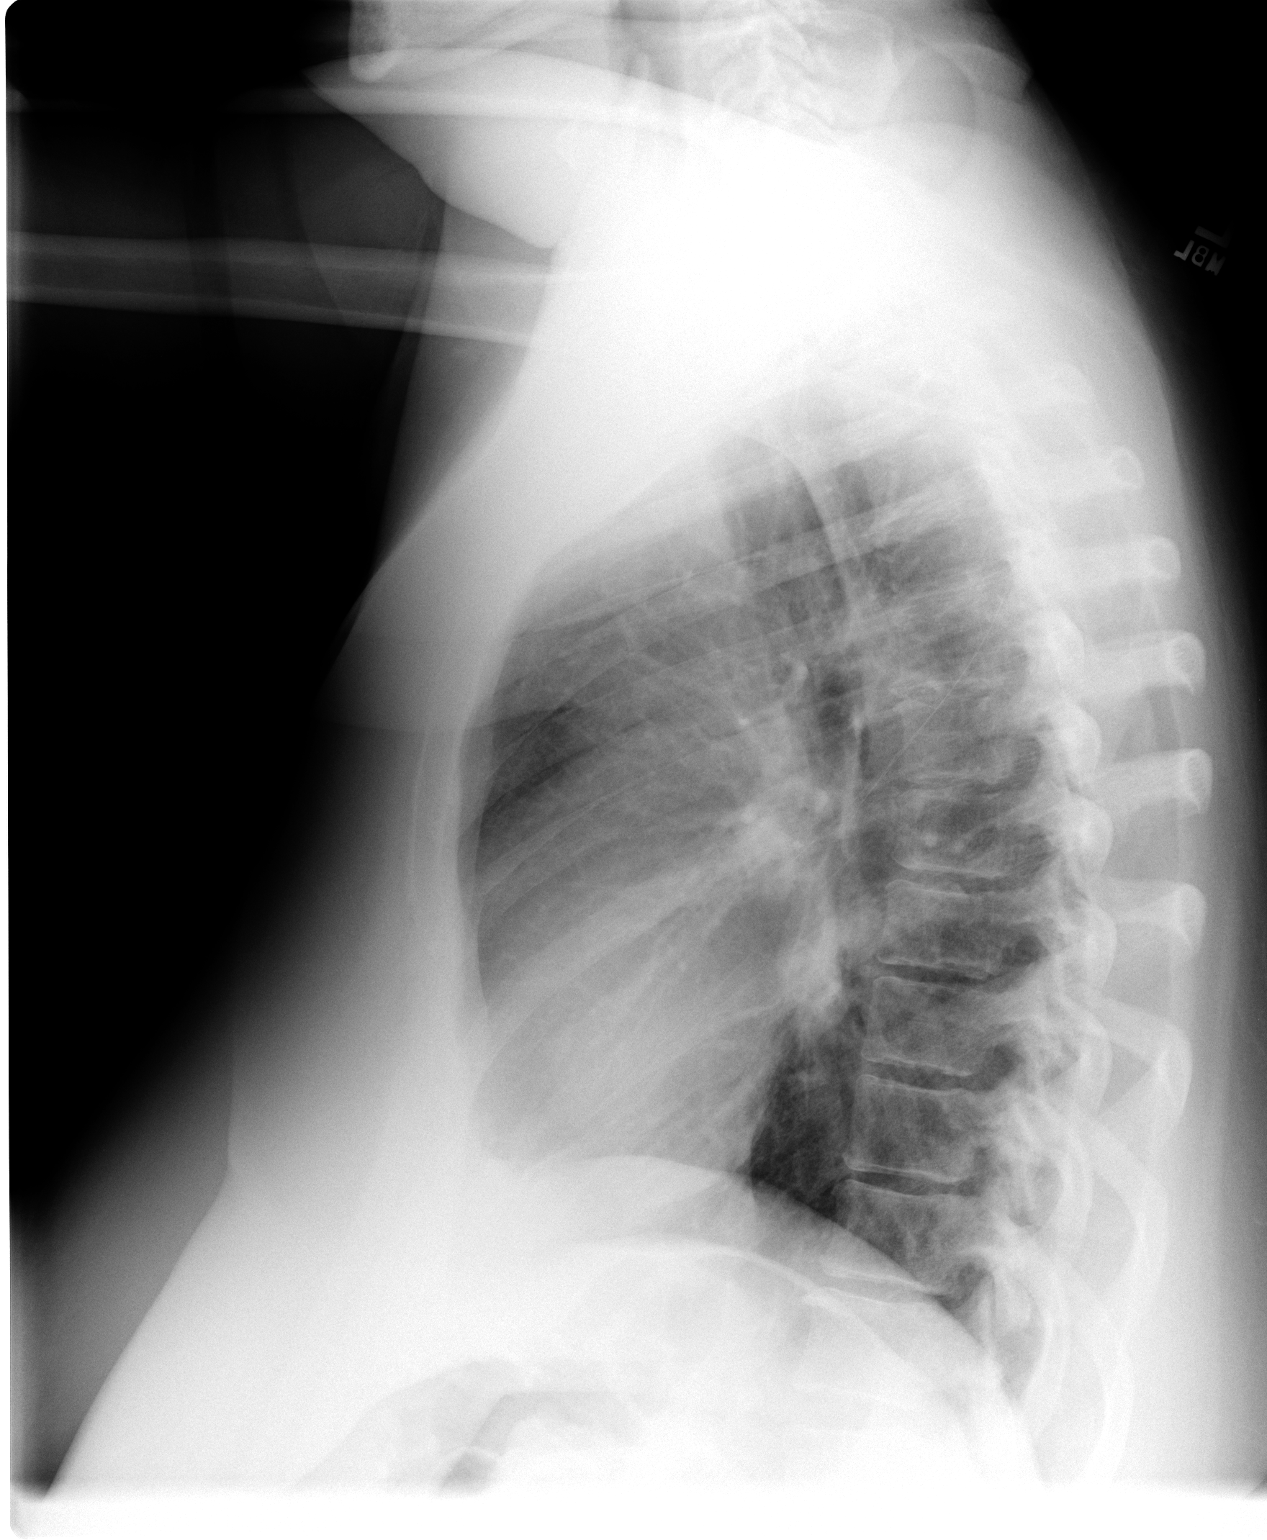

[3 of 3 positions shown; findings below may reference images not displayed]

FINDINGS: Lungs clear.  Heart size and pulmonary vascularity are
normal.  No adenopathy.  No bone lesions.
IMPRESSION: Lungs clear.

## 2014-09-05 ENCOUNTER — Encounter (HOSPITAL_COMMUNITY): Payer: Self-pay | Admitting: Emergency Medicine

## 2014-09-05 ENCOUNTER — Emergency Department (HOSPITAL_COMMUNITY)
Admission: EM | Admit: 2014-09-05 | Discharge: 2014-09-05 | Disposition: A | Discharge status: Home / Self Care | Payer: Medicaid Other | Attending: Emergency Medicine | Admitting: Emergency Medicine

## 2014-09-05 DIAGNOSIS — I10 Essential (primary) hypertension: Secondary | ICD-10-CM | POA: Insufficient documentation

## 2014-09-05 DIAGNOSIS — Z720 Tobacco use: Secondary | ICD-10-CM | POA: Insufficient documentation

## 2014-09-05 DIAGNOSIS — R112 Nausea with vomiting, unspecified: Secondary | ICD-10-CM | POA: Diagnosis not present

## 2014-09-05 DIAGNOSIS — R42 Dizziness and giddiness: Secondary | ICD-10-CM

## 2014-09-05 DIAGNOSIS — H8109 Meniere's disease, unspecified ear: Secondary | ICD-10-CM | POA: Diagnosis not present

## 2014-09-05 DIAGNOSIS — Z79899 Other long term (current) drug therapy: Secondary | ICD-10-CM | POA: Insufficient documentation

## 2014-09-05 HISTORY — DX: Meniere's disease, unspecified ear: H81.09

## 2014-09-05 LAB — COMPREHENSIVE METABOLIC PANEL
ALT: 7 U/L (ref 0–35)
ANION GAP: 12 (ref 5–15)
AST: 12 U/L (ref 0–37)
Albumin: 3.4 g/dL — ABNORMAL LOW (ref 3.5–5.2)
Alkaline Phosphatase: 72 U/L (ref 39–117)
BUN: 12 mg/dL (ref 6–23)
CO2: 25 mEq/L (ref 19–32)
Calcium: 9 mg/dL (ref 8.4–10.5)
Chloride: 104 mEq/L (ref 96–112)
Creatinine, Ser: 0.82 mg/dL (ref 0.50–1.10)
GFR calc Af Amer: >90 mL/min (ref >90)
GFR calc non Af Amer: 87 mL/min — ABNORMAL LOW (ref >90)
Glucose, Bld: 109 mg/dL — ABNORMAL HIGH (ref 70–99)
POTASSIUM: 3.9 meq/L (ref 3.7–5.3)
Sodium: 141 mEq/L (ref 137–147)
TOTAL PROTEIN: 7 g/dL (ref 6.0–8.3)
Total Bilirubin: 0.3 mg/dL (ref 0.3–1.2)

## 2014-09-05 LAB — CBC WITH DIFFERENTIAL/PLATELET
BASOS PCT: 0 % (ref 0–1)
Basophils Absolute: 0 10*3/uL (ref 0.0–0.1)
EOS ABS: 0.2 10*3/uL (ref 0.0–0.7)
Eosinophils Relative: 2 % (ref 0–5)
HCT: 35.7 % — ABNORMAL LOW (ref 36.0–46.0)
Hemoglobin: 11.9 g/dL — ABNORMAL LOW (ref 12.0–15.0)
Lymphocytes Relative: 10 % — ABNORMAL LOW (ref 12–46)
Lymphs Abs: 0.8 10*3/uL (ref 0.7–4.0)
MCH: 26.6 pg (ref 26.0–34.0)
MCHC: 33.3 g/dL (ref 30.0–36.0)
MCV: 79.9 fL (ref 78.0–100.0)
MONOS PCT: 8 % (ref 3–12)
Monocytes Absolute: 0.7 10*3/uL (ref 0.1–1.0)
NEUTROS PCT: 80 % — AB (ref 43–77)
Neutro Abs: 6.9 10*3/uL (ref 1.7–7.7)
Platelets: 336 10*3/uL (ref 150–400)
RBC: 4.47 MIL/uL (ref 3.87–5.11)
RDW: 16 % — AB (ref 11.5–15.5)
WBC: 8.6 10*3/uL (ref 4.0–10.5)

## 2014-09-05 MED ORDER — DIAZEPAM 5 MG/ML IJ SOLN
5.0000 mg | Freq: Once | INTRAMUSCULAR | Status: AC
  Administered 2014-09-05: 5 mg via INTRAVENOUS
  Filled 2014-09-05: qty 2

## 2014-09-05 MED ORDER — SODIUM CHLORIDE 0.9 % IV BOLUS (SEPSIS)
1000.0000 mL | Freq: Once | INTRAVENOUS | Status: AC
  Administered 2014-09-05: 1000 mL via INTRAVENOUS

## 2014-09-05 NOTE — Discharge Instructions (Signed)
Meniere Disease Meniere disease is an inner ear disorder. It causes attacks of a spinning sensation (vertigo) and ringing in the ear (tinnitus). It also causes hearing loss and a sensation of fullness or pressure in your ear.  Meniere disease is lifelong. It may get worse over time. Symptoms usually begin in one ear but may eventually affect both ears.  CAUSES Meniere disease is caused by having too much of the fluid that is in your inner ear (endolymph). When endolymph builds up in your inner ear, it affects the nerves that control balance and hearing. The reason for the endolymph buildup is not known. Possible causes include:  Allergy.  An abnormal reaction of the body's defense system (autoimmune disease).  Viral infection of the inner ear.  Head injury. RISK FACTORS  Age older than 40 years.  Family history of Meniere disease.  History of autoimmune disease.  History of migraine headaches. SIGNS AND SYMPTOMS Symptoms of Meniere disease can come and go and may last for up to 4 hours at a time. Symptoms usually start in one ear and may become more frequent and eventually involve both ears. Symptoms can include:  Fullness and pressure in your ear.  Roaring or ringing in your ear.  Vertigo and loss of balance.  Decreased hearing.  Nausea and vomiting. DIAGNOSIS Your health care provider will perform a physical exam. Tests may be done to confirm a diagnosis of Meniere disease. These tests may include:  A hearing test (audiogram).  An electronystagmogram. This tests your balance nerve (vestibular nerve).  Imaging studies, such as CT or MRI, of your inner ear. TREATMENT There is no cure for Meniere disease, but it can be managed. Management may include:  A diet that may help relieve symptoms of Meniere disease.  Use of medicines to reduce:  Vertigo.  Nausea.  Fluid retention.  Use of an air pressure pulse generator. This is a machine that sends small pressure  pulses into your ear canal.  Inner ear surgery (rare). When you experience symptoms, it can be helpful to lie down on a flat surface and focus your eyes on one object that does not move. Try to stay in that position until your symptoms go away.  HOME CARE INSTRUCTIONS   Take medicines only as directed by your health care provider.  Eat the same amount of food at the same time every day, including snacks.  Do not skip meals.  Limit the salt in your diet to 1,000 mg a day.  Avoid caffeine.  Limit alcoholic drinks to one drink a day.  Do not eat foods containing monosodium glutamate (MSG).  Drink enough fluids to keep your urine clear or pale yellow.  Do not use any tobacco products including cigarettes, chewing tobacco, or electronic cigarettes. If you need help quitting, ask your health care provider.  Find ways to reduce or avoid stress. SEEK MEDICAL CARE IF:   You have symptoms that last longer than 4 hours.  You have new or more severe symptoms. SEEK IMMEDIATE MEDICAL CARE IF:   You have been vomiting for 24 hours.  You are not able to keep fluids down.  You have chest pain or trouble breathing. Document Released: 11/05/2000 Document Revised: 03/25/2014 Document Reviewed: 10/22/2013 [facility] Patient Information 2015 [place], [place]. This information is not intended to replace advice given to you by your health care provider. Make sure you discuss any questions you have with your health care provider.  Vertigo Vertigo means you feel like you  or your surroundings are moving when they are not. Vertigo can be dangerous if it occurs when you are at work, driving, or performing difficult activities.  CAUSES  Vertigo occurs when there is a conflict of signals sent to your brain from the visual and sensory systems in your body. There are many different causes of vertigo, including:  Infections, especially in the inner ear.  A bad reaction to a drug or misuse of alcohol and  medicines.  Withdrawal from drugs or alcohol.  Rapidly changing positions, such as lying down or rolling over in bed.  A migraine headache.  Decreased blood flow to the brain.  Increased pressure in the brain from a head injury, infection, tumor, or bleeding. SYMPTOMS  You may feel as though the world is spinning around or you are falling to the ground. Because your balance is upset, vertigo can cause nausea and vomiting. You may have involuntary eye movements (nystagmus). DIAGNOSIS  Vertigo is usually diagnosed by physical exam. If the cause of your vertigo is unknown, your caregiver may perform imaging tests, such as an MRI scan (magnetic resonance imaging). TREATMENT  Most cases of vertigo resolve on their own, without treatment. Depending on the cause, your caregiver may prescribe certain medicines. If your vertigo is related to body position issues, your caregiver may recommend movements or procedures to correct the problem. In rare cases, if your vertigo is caused by certain inner ear problems, you may need surgery. HOME CARE INSTRUCTIONS   Follow your caregiver's instructions.  Avoid driving.  Avoid operating heavy machinery.  Avoid performing any tasks that would be dangerous to you or others during a vertigo episode.  Tell your caregiver if you notice that certain medicines seem to be causing your vertigo. Some of the medicines used to treat vertigo episodes can actually make them worse in some people. SEEK IMMEDIATE MEDICAL CARE IF:   Your medicines do not relieve your vertigo or are making it worse.  You develop problems with talking, walking, weakness, or using your arms, hands, or legs.  You develop severe headaches.  Your nausea or vomiting continues or gets worse.  You develop visual changes.  A family member notices behavioral changes.  Your condition gets worse. MAKE SURE YOU:  Understand these instructions.  Will watch your condition.  Will get help  right away if you are not doing well or get worse. Document Released: 08/18/2005 Document Revised: 01/31/2012 Document Reviewed: 05/27/2011 [facility] Patient Information 2015 [place], [place]. This information is not intended to replace advice given to you by your health care provider. Make sure you discuss any questions you have with your health care provider.

## 2014-09-05 NOTE — ED Provider Notes (Signed)
CSN: [id number]     Arrival date & time 09/05/14  [id number] History   First MD Initiated Contact with Patient 09/05/14 [phone]     Chief Complaint  Patient presents with  . Dizziness  . Nausea  . Emesis     (Consider location/radiation/quality/duration/timing/severity/associated sxs/prior Treatment) HPI Comments: Patient presents to the ER for evaluation of dizziness. Patient reports that she was awakened from sleep by severe dizziness with nausea and vomiting. Patient reports a history of Mnire's disease. She reports that these symptoms are similar to her previous episodes. She normally takes Zofran and meclizine, but it did not work. She reports occasionally the medication does not work and she requires IV fluids in the ER. Patient denies headache. No fever. No chest pain, shortness of breath. Patient has not noticed any numbness, tingling or weakness of any extremities.  Patient is a 43 y.o. female presenting with dizziness and vomiting.  Dizziness Associated symptoms: nausea and vomiting   Emesis   Past Medical History  Diagnosis Date  . Hypertension   . Meniere disease 2014   Past Surgical History  Procedure Laterality Date  . Appendectomy    . Cesarean section w/btl     History reviewed. No pertinent family history. History  Substance Use Topics  . Smoking status: Current Every Day Smoker  . Smokeless tobacco: Not on file  . Alcohol Use: No   OB History   Grav Para Term Preterm Abortions TAB SAB Ect Mult Living                 Review of Systems  Gastrointestinal: Positive for nausea and vomiting.  Neurological: Positive for dizziness.  All other systems reviewed and are negative.     Allergies  Chocolate and Peanut-containing drug products  Home Medications   Prior to Admission medications   Medication Sig Start Date End Date Taking? Authorizing Provider  hydrochlorothiazide (HYDRODIURIL) 25 MG tablet Take 25 mg by mouth daily with breakfast.   Yes Historical  Provider, MD  HYDROcodone-acetaminophen (NORCO) 10-325 MG per tablet Take 0.5-1 tablets by mouth every 4 (four) hours as needed for moderate pain.   Yes Historical Provider, MD  ibuprofen (ADVIL,MOTRIN) 200 MG tablet Take 2,000 mg by mouth every 4 (four) hours as needed for moderate pain.   Yes Historical Provider, MD  meclizine (ANTIVERT) 50 MG tablet Take 250-300 mg by mouth daily as needed for dizziness.   Yes Historical Provider, MD  methocarbamol (ROBAXIN) 750 MG tablet Take 750 mg by mouth 5 (five) times daily as needed for muscle spasms.   Yes Historical Provider, MD  ondansetron (ZOFRAN-ODT) 4 MG disintegrating tablet Take 8-12 mg by mouth every 4 (four) hours as needed for nausea or vomiting.   Yes Historical Provider, MD   BP 128/79  Pulse 75  Temp(Src) 98.2 F (36.8 C) (Oral)  Resp 18  SpO2 100% Physical Exam  Constitutional: She is oriented to person, place, and time. She appears well-developed and well-nourished. No distress.  HENT:  Head: Normocephalic and atraumatic.  Right Ear: Hearing normal.  Left Ear: Hearing normal.  Nose: Nose normal.  Mouth/Throat: Oropharynx is clear and moist and mucous membranes are normal.  Eyes: Conjunctivae and EOM are normal. Pupils are equal, round, and reactive to light.  Neck: Normal range of motion. Neck supple.  Cardiovascular: Regular rhythm, S1 normal and S2 normal.  Exam reveals no gallop and no friction rub.   No murmur heard. Pulmonary/Chest: Effort normal and breath sounds normal. No  respiratory distress. She exhibits no tenderness.  Abdominal: Soft. Normal appearance and bowel sounds are normal. There is no hepatosplenomegaly. There is no tenderness. There is no rebound, no guarding, no tenderness at McBurney's point and negative Murphy's sign. No hernia.  Musculoskeletal: Normal range of motion.  Neurological: She is alert and oriented to person, place, and time. She has normal strength. No cranial nerve deficit or sensory  deficit. Coordination normal. GCS eye subscore is 4. GCS verbal subscore is 5. GCS motor subscore is 6.  Skin: Skin is warm, dry and intact. No rash noted. No cyanosis.  Psychiatric: She has a normal mood and affect. Her speech is normal and behavior is normal. Thought content normal.    ED Course  Procedures (including critical care time) Labs Review Labs Reviewed  CBC WITH DIFFERENTIAL - Abnormal; Notable for the following:    Hemoglobin 11.9 (*)    HCT 35.7 (*)    RDW 16.0 (*)    Neutrophils Relative % 80 (*)    Lymphocytes Relative 10 (*)    All other components within normal limits  COMPREHENSIVE METABOLIC PANEL - Abnormal; Notable for the following:    Glucose, Bld 109 (*)    Albumin 3.4 (*)    GFR calc non Af Amer 87 (*)    All other components within normal limits  URINALYSIS, ROUTINE W REFLEX MICROSCOPIC    Imaging Review No results found.   EKG Interpretation None      MDM   Final diagnoses:  None   Patient presents to the ER for evaluation of dizziness. Patient reports a previous history of Mnire's disease with severe vertigo symptoms. Symptoms today are similar. The patient took Zofran and meclizine without improvement of her symptoms, which he reports does help sometimes. She does not have any focal neurologic findings. Patient has had significant improvement after IV fluids and IV Valium. She has not had any further nausea or vomiting, reports that her dizziness has resolved. Patient will be discharged, follow up with her doctors as needed.    Gilda Creasehristopher J. Grey Schlauch, MD 09/05/14 [phone]

## 2014-09-05 NOTE — ED Notes (Signed)
Bed: WA03 Expected date:  Expected time:  Means of arrival:  Comments: EMS 20F vertigo, N/V

## 2014-09-05 NOTE — ED Notes (Signed)
Pt ambulated independently to void and denies dizziness. Pt stable with ambulation.

## 2014-09-05 NOTE — ED Notes (Signed)
Patient reports awakening from sleep with severe dizziness which caused nausea and vomiting.  She is being treated for Meniere's disease, but her home cocktail of Zofran and Meclizine did not work due to continued vomiting.  She reports mild nausea at this time, following EMS administration of Zofran.

## 2015-05-07 ENCOUNTER — Other Ambulatory Visit: Payer: Self-pay | Admitting: Nurse Practitioner

## 2015-05-07 DIAGNOSIS — Z1231 Encounter for screening mammogram for malignant neoplasm of breast: Secondary | ICD-10-CM

## 2015-05-15 ENCOUNTER — Ambulatory Visit: Payer: Medicaid Other

## 2016-02-17 ENCOUNTER — Emergency Department (HOSPITAL_COMMUNITY)
Admission: EM | Admit: 2016-02-17 | Discharge: 2016-02-17 | Disposition: A | Discharge status: Home / Self Care | Payer: Medicaid Other | Source: Home / Self Care | Attending: Family Medicine | Admitting: Family Medicine

## 2016-02-17 DIAGNOSIS — J039 Acute tonsillitis, unspecified: Secondary | ICD-10-CM

## 2016-02-17 MED ORDER — AMOXICILLIN 500 MG PO CAPS: 500.0000 mg | ORAL_CAPSULE | Freq: Three times a day (TID) | ORAL | Status: DC

## 2016-02-17 NOTE — ED Provider Notes (Signed)
CSN: [id number]     Arrival date & time 02/17/16  1337 History   First MD Initiated Contact with Patient 02/17/16 1557     No chief complaint on file.  (Consider location/radiation/quality/duration/timing/severity/associated sxs/prior Treatment) HPI History obtained from patient:   LOCATION: right ear and throat SEVERITY:4 DURATION:3 days CONTEXT:sudden onset QUALITY: MODIFYING FACTORS:OTC meds ASSOCIATED SYMPTOMS:none TIMING:constant OCCUPATION:cashier  Past Medical History  Diagnosis Date  . Hypertension   . Meniere disease 2014   Past Surgical History  Procedure Laterality Date  . Appendectomy    . Cesarean section w/btl     No family history on file. Social History  Substance Use Topics  . Smoking status: Current Every Day Smoker  . Smokeless tobacco: Not on file  . Alcohol Use: No   OB History    No data available     Review of Systems  right ear pain, sore throat  Allergies  Chocolate and Peanut-containing drug products  Home Medications   Prior to Admission medications   Medication Sig Start Date End Date Taking? Authorizing Provider  hydrochlorothiazide (HYDRODIURIL) 25 MG tablet Take 25 mg by mouth daily with breakfast.    Historical Provider, MD  HYDROcodone-acetaminophen (NORCO) 10-325 MG per tablet Take 0.5-1 tablets by mouth every 4 (four) hours as needed for moderate pain.    Historical Provider, MD  ibuprofen (ADVIL,MOTRIN) 200 MG tablet Take 2,000 mg by mouth every 4 (four) hours as needed for moderate pain.    Historical Provider, MD  meclizine (ANTIVERT) 50 MG tablet Take 250-300 mg by mouth daily as needed for dizziness.    Historical Provider, MD  methocarbamol (ROBAXIN) 750 MG tablet Take 750 mg by mouth 5 (five) times daily as needed for muscle spasms.    Historical Provider, MD  ondansetron (ZOFRAN-ODT) 4 MG disintegrating tablet Take 8-12 mg by mouth every 4 (four) hours as needed for nausea or vomiting.    Historical Provider, MD    Meds Ordered and Administered this Visit  Medications - No data to display  BP 142/84 mmHg  Pulse 71  Temp(Src) 98.3 F (36.8 C) (Oral)  Resp 16  SpO2 100% No data found.   Physical Exam NURSES NOTES AND VITAL SIGNS REVIEWED. CONSTITUTIONAL: Well developed, well nourished, no acute distress HEENT: normocephalic, atraumatic, right and left TM's are normal EYES: Conjunctiva normal NECK:normal ROM, supple, no adenopathy PULMONARY:No respiratory distress, normal effort, Lungs: CTAb/l, no wheezes, or increased work of breathing CARDIOVASCULAR: RRR, no murmur ABDOMEN: soft, ND, NT, +'ve BS MUSCULOSKELETAL: Normal ROM of all extremities,  SKIN: warm and dry without rash PSYCHIATRIC: Mood and affect, behavior are normal  ED Course  Procedures (including critical care time)  Labs Review Labs Reviewed - No data to display  Imaging Review No results found.   Visual Acuity Review  Right Eye Distance:   Left Eye Distance:   Bilateral Distance:    Right Eye Near:   Left Eye Near:    Bilateral Near:       rx amoxil  MDM   1. Tonsillitis with exudate     Patient is reassured that there are no issues that require transfer to higher level of care at this time or additional tests. Patient is advised to continue home symptomatic treatment. Patient is advised that if there are new or worsening symptoms to attend the emergency department, contact primary care provider, or return to UC. Instructions of care provided discharged home in stable condition. Return to work/school note provided.  THIS NOTE WAS GENERATED USING A VOICE RECOGNITION SOFTWARE PROGRAM. ALL REASONABLE EFFORTS  WERE MADE TO PROOFREAD THIS DOCUMENT FOR ACCURACY.  I have verbally reviewed the discharge instructions with the patient. A printed AVS was given to the patient.  All questions were answered prior to discharge.      Tharon Aquas, PA 02/17/16 1708

## 2016-02-17 NOTE — Discharge Instructions (Signed)
Tonsillitis °Tonsillitis is an infection of the throat. This infection causes the tonsils to become red, tender, and puffy (swollen). Tonsils are groups of tissue at the back of your throat. If bacteria caused your infection, antibiotic medicine will be given to you. Sometimes symptoms of tonsillitis can be relieved with the use of steroid medicine. If your tonsillitis is severe and happens often, you may need to get your tonsils removed (tonsillectomy). °HOME CARE  °· Rest and sleep often. °· Drink enough fluids to keep your pee (urine) clear or pale yellow. °· While your throat is sore, eat soft or liquid foods like: °¨ Soup. °¨ Ice cream. °¨ Instant breakfast drinks. °· Eat frozen ice pops. °· Gargle with a warm or cold liquid to help soothe the throat. Gargle with a water and salt mix. Mix 1/4 teaspoon of salt and 1/4 teaspoon of baking soda in 1 cup of water. °· Only take medicines as told by your doctor. °· If you are given medicines (antibiotics), take them as told. Finish them even if you start to feel better. °GET HELP IF: °· You have large, tender lumps in your neck. °· You have a rash. °· You cough up green, yellow-brown, or bloody fluid. °· You cannot swallow liquids or food for 24 hours. °· You notice that only one of your tonsils is swollen. °GET HELP RIGHT AWAY IF:  °· You throw up (vomit). °· You have a very bad headache. °· You have a stiff neck. °· You have chest pain. °· You have trouble breathing or swallowing. °· You have bad throat pain, drooling, or your voice changes. °· You have bad pain not helped by medicine. °· You cannot fully open your mouth. °· You have redness, puffiness, or bad pain in the neck. °· You have a fever. °MAKE SURE YOU:  °· Understand these instructions. °· Will watch your condition. °· Will get help right away if you are not doing well or get worse. °  °This information is not intended to replace advice given to you by your health care provider. Make sure you discuss any  questions you have with your health care provider. °  °Document Released: 04/26/2008 Document Revised: 11/13/2013 Document Reviewed: 04/27/2013 °Elsevier Interactive Patient Education ©2016 [employer] ° °

## 2016-03-31 ENCOUNTER — Encounter (HOSPITAL_COMMUNITY): Payer: Self-pay | Admitting: Emergency Medicine

## 2016-03-31 ENCOUNTER — Observation Stay (HOSPITAL_COMMUNITY)
Admission: EM | Admit: 2016-03-31 | Discharge: 2016-03-31 | Disposition: A | Discharge status: Home / Self Care | Payer: Medicaid Other | Attending: Orthopedic Surgery | Admitting: Orthopedic Surgery

## 2016-03-31 ENCOUNTER — Observation Stay (HOSPITAL_COMMUNITY): Payer: Medicaid Other | Admitting: Anesthesiology

## 2016-03-31 ENCOUNTER — Emergency Department (HOSPITAL_COMMUNITY): Payer: Medicaid Other

## 2016-03-31 ENCOUNTER — Encounter (HOSPITAL_COMMUNITY)
Admission: EM | Disposition: A | Discharge status: Home / Self Care | Payer: Self-pay | Source: Home / Self Care | Attending: Emergency Medicine

## 2016-03-31 DIAGNOSIS — S948X1A Injury of other nerves at ankle and foot level, right leg, initial encounter: Secondary | ICD-10-CM | POA: Diagnosis not present

## 2016-03-31 DIAGNOSIS — W458XXA Other foreign body or object entering through skin, initial encounter: Secondary | ICD-10-CM | POA: Insufficient documentation

## 2016-03-31 DIAGNOSIS — Z91018 Allergy to other foods: Secondary | ICD-10-CM | POA: Insufficient documentation

## 2016-03-31 DIAGNOSIS — I10 Essential (primary) hypertension: Secondary | ICD-10-CM | POA: Insufficient documentation

## 2016-03-31 DIAGNOSIS — F319 Bipolar disorder, unspecified: Secondary | ICD-10-CM | POA: Insufficient documentation

## 2016-03-31 DIAGNOSIS — S91312A Laceration without foreign body, left foot, initial encounter: Secondary | ICD-10-CM | POA: Diagnosis present

## 2016-03-31 DIAGNOSIS — Y9289 Other specified places as the place of occurrence of the external cause: Secondary | ICD-10-CM | POA: Insufficient documentation

## 2016-03-31 DIAGNOSIS — Y9389 Activity, other specified: Secondary | ICD-10-CM | POA: Diagnosis not present

## 2016-03-31 DIAGNOSIS — F17210 Nicotine dependence, cigarettes, uncomplicated: Secondary | ICD-10-CM | POA: Insufficient documentation

## 2016-03-31 DIAGNOSIS — S91321A Laceration with foreign body, right foot, initial encounter: Secondary | ICD-10-CM | POA: Diagnosis not present

## 2016-03-31 DIAGNOSIS — Z91010 Allergy to peanuts: Secondary | ICD-10-CM | POA: Insufficient documentation

## 2016-03-31 DIAGNOSIS — K219 Gastro-esophageal reflux disease without esophagitis: Secondary | ICD-10-CM | POA: Insufficient documentation

## 2016-03-31 DIAGNOSIS — Y998 Other external cause status: Secondary | ICD-10-CM | POA: Insufficient documentation

## 2016-03-31 DIAGNOSIS — Z1881 Retained glass fragments: Secondary | ICD-10-CM | POA: Diagnosis not present

## 2016-03-31 DIAGNOSIS — S90851A Superficial foreign body, right foot, initial encounter: Secondary | ICD-10-CM | POA: Diagnosis present

## 2016-03-31 DIAGNOSIS — W25XXXA Contact with sharp glass, initial encounter: Secondary | ICD-10-CM | POA: Insufficient documentation

## 2016-03-31 HISTORY — PX: I&D EXTREMITY: SHX5045

## 2016-03-31 LAB — CBC WITH DIFFERENTIAL/PLATELET
Basophils Absolute: 0 10*3/uL (ref 0.0–0.1)
Basophils Relative: 0 %
Eosinophils Absolute: 0.1 10*3/uL (ref 0.0–0.7)
Eosinophils Relative: 2 %
HCT: 37.1 % (ref 36.0–46.0)
HEMOGLOBIN: 12.3 g/dL (ref 12.0–15.0)
LYMPHS ABS: 1.7 10*3/uL (ref 0.7–4.0)
LYMPHS PCT: 28 %
MCH: 27.6 pg (ref 26.0–34.0)
MCHC: 33.2 g/dL (ref 30.0–36.0)
MCV: 83.2 fL (ref 78.0–100.0)
MONO ABS: 0.3 10*3/uL (ref 0.1–1.0)
Monocytes Relative: 6 %
Neutro Abs: 4 10*3/uL (ref 1.7–7.7)
Neutrophils Relative %: 64 %
Platelets: 322 10*3/uL (ref 150–400)
RBC: 4.46 MIL/uL (ref 3.87–5.11)
RDW: 16.8 % — AB (ref 11.5–15.5)
WBC: 6.2 10*3/uL (ref 4.0–10.5)

## 2016-03-31 LAB — BASIC METABOLIC PANEL
Anion gap: 7 (ref 5–15)
BUN: 14 mg/dL (ref 6–20)
CALCIUM: 8.9 mg/dL (ref 8.9–10.3)
CO2: 28 mmol/L (ref 22–32)
CREATININE: 0.73 mg/dL (ref 0.44–1.00)
Chloride: 107 mmol/L (ref 101–111)
GFR calc non Af Amer: >60 mL/min (ref >60)
GLUCOSE: 101 mg/dL — AB (ref 65–99)
Potassium: 3.7 mmol/L (ref 3.5–5.1)
Sodium: 142 mmol/L (ref 135–145)

## 2016-03-31 LAB — HCG, QUANTITATIVE, PREGNANCY: hCG, Beta Chain, Quant, S: <1 m[IU]/mL (ref <5)

## 2016-03-31 SURGERY — IRRIGATION AND DEBRIDEMENT EXTREMITY
Anesthesia: General | Site: Foot | Laterality: Right

## 2016-03-31 MED ORDER — PHENYLEPHRINE 40 MCG/ML (10ML) SYRINGE FOR IV PUSH (FOR BLOOD PRESSURE SUPPORT)
PREFILLED_SYRINGE | INTRAVENOUS | Status: AC
  Filled 2016-03-31: qty 10

## 2016-03-31 MED ORDER — HYDROCODONE-ACETAMINOPHEN 5-325 MG PO TABS: 1.0000 | ORAL_TABLET | Freq: Four times a day (QID) | ORAL | Status: AC | PRN

## 2016-03-31 MED ORDER — LACTATED RINGERS IV SOLN
INTRAVENOUS | Status: DC
  Administered 2016-03-31: 17:00:00 via INTRAVENOUS

## 2016-03-31 MED ORDER — LACTATED RINGERS IV SOLN
INTRAVENOUS | Status: DC | PRN
  Administered 2016-03-31: 17:00:00 via INTRAVENOUS

## 2016-03-31 MED ORDER — GENTAMICIN SULFATE 40 MG/ML IJ SOLN
INTRAMUSCULAR | Status: AC
  Filled 2016-03-31: qty 2

## 2016-03-31 MED ORDER — DEXAMETHASONE SODIUM PHOSPHATE 4 MG/ML IJ SOLN
INTRAMUSCULAR | Status: DC | PRN
  Administered 2016-03-31: 17 mg via INTRAVENOUS

## 2016-03-31 MED ORDER — MEPERIDINE HCL 25 MG/ML IJ SOLN: 6.2500 mg | INTRAMUSCULAR | Status: DC | PRN

## 2016-03-31 MED ORDER — PROCHLORPERAZINE EDISYLATE 5 MG/ML IJ SOLN: 10.0000 mg | INTRAMUSCULAR | Status: DC | PRN

## 2016-03-31 MED ORDER — DEXAMETHASONE SODIUM PHOSPHATE 10 MG/ML IJ SOLN
INTRAMUSCULAR | Status: AC
  Filled 2016-03-31: qty 1

## 2016-03-31 MED ORDER — PROPOFOL 10 MG/ML IV BOLUS
INTRAVENOUS | Status: DC | PRN
  Administered 2016-03-31: 200 mg via INTRAVENOUS

## 2016-03-31 MED ORDER — LIDOCAINE HCL (CARDIAC) 20 MG/ML IV SOLN
INTRAVENOUS | Status: DC | PRN
  Administered 2016-03-31: 60 mg via INTRAVENOUS

## 2016-03-31 MED ORDER — ROCURONIUM BROMIDE 50 MG/5ML IV SOLN
INTRAVENOUS | Status: AC
  Filled 2016-03-31: qty 1

## 2016-03-31 MED ORDER — HYDROGEN PEROXIDE 3 % EX SOLN
CUTANEOUS | Status: AC
  Administered 2016-03-31: 13:00:00
  Filled 2016-03-31: qty 473

## 2016-03-31 MED ORDER — ONDANSETRON HCL 4 MG/2ML IJ SOLN
INTRAMUSCULAR | Status: AC
  Filled 2016-03-31: qty 6

## 2016-03-31 MED ORDER — FENTANYL CITRATE (PF) 250 MCG/5ML IJ SOLN
INTRAMUSCULAR | Status: AC
  Filled 2016-03-31: qty 5

## 2016-03-31 MED ORDER — VANCOMYCIN HCL 500 MG IV SOLR
INTRAVENOUS | Status: AC
  Filled 2016-03-31: qty 500

## 2016-03-31 MED ORDER — LIDOCAINE-EPINEPHRINE 0.5 %-1:200000 IJ SOLN
INTRAMUSCULAR | Status: DC | PRN
  Administered 2016-03-31: 14 mL via INTRADERMAL

## 2016-03-31 MED ORDER — SODIUM CHLORIDE 0.9 % IR SOLN
Status: DC | PRN
  Administered 2016-03-31: 3000 mL

## 2016-03-31 MED ORDER — MIDAZOLAM HCL 2 MG/2ML IJ SOLN
INTRAMUSCULAR | Status: AC
  Filled 2016-03-31: qty 2

## 2016-03-31 MED ORDER — CEFAZOLIN SODIUM-DEXTROSE 2-4 GM/100ML-% IV SOLN
INTRAVENOUS | Status: AC
  Administered 2016-03-31: 2 g via INTRAVENOUS
  Filled 2016-03-31: qty 100

## 2016-03-31 MED ORDER — LACTATED RINGERS IV SOLN: INTRAVENOUS | Status: DC

## 2016-03-31 MED ORDER — FENTANYL CITRATE (PF) 100 MCG/2ML IJ SOLN
INTRAMUSCULAR | Status: DC | PRN
  Administered 2016-03-31: 25 ug via INTRAVENOUS
  Administered 2016-03-31: 50 ug via INTRAVENOUS
  Administered 2016-03-31 (×2): 25 ug via INTRAVENOUS

## 2016-03-31 MED ORDER — BACITRACIN ZINC 500 UNIT/GM EX OINT
TOPICAL_OINTMENT | CUTANEOUS | Status: AC
  Filled 2016-03-31: qty 28.35

## 2016-03-31 MED ORDER — 0.9 % SODIUM CHLORIDE (POUR BTL) OPTIME
TOPICAL | Status: DC | PRN
  Administered 2016-03-31: 1000 mL

## 2016-03-31 MED ORDER — HYDROMORPHONE HCL 1 MG/ML IJ SOLN: 0.2500 mg | INTRAMUSCULAR | Status: DC | PRN

## 2016-03-31 MED ORDER — PROPOFOL 10 MG/ML IV BOLUS
INTRAVENOUS | Status: AC
  Filled 2016-03-31: qty 20

## 2016-03-31 MED ORDER — LIDOCAINE 2% (20 MG/ML) 5 ML SYRINGE
INTRAMUSCULAR | Status: AC
  Filled 2016-03-31: qty 20

## 2016-03-31 MED ORDER — MIDAZOLAM HCL 5 MG/5ML IJ SOLN
INTRAMUSCULAR | Status: DC | PRN
  Administered 2016-03-31: 2 mg via INTRAVENOUS

## 2016-03-31 MED ORDER — SUCCINYLCHOLINE CHLORIDE 200 MG/10ML IV SOSY
PREFILLED_SYRINGE | INTRAVENOUS | Status: AC
  Filled 2016-03-31: qty 10

## 2016-03-31 MED ORDER — LIDOCAINE-EPINEPHRINE 0.5 %-1:200000 IJ SOLN
INTRAMUSCULAR | Status: AC
  Filled 2016-03-31: qty 1

## 2016-03-31 MED ORDER — ONDANSETRON HCL 4 MG/2ML IJ SOLN
INTRAMUSCULAR | Status: DC | PRN
  Administered 2016-03-31: 4 mg via INTRAVENOUS

## 2016-03-31 MED ORDER — VANCOMYCIN HCL 1000 MG IV SOLR
INTRAVENOUS | Status: AC
  Filled 2016-03-31: qty 1000

## 2016-03-31 SURGICAL SUPPLY — 42 items
BANDAGE ELASTIC 4 VELCRO ST LF (GAUZE/BANDAGES/DRESSINGS) ×3 IMPLANT
BANDAGE ESMARK 6X9 LF (GAUZE/BANDAGES/DRESSINGS) ×1 IMPLANT
BLADE SURG 10 STRL SS (BLADE) ×3 IMPLANT
BNDG CONFORM 3 STRL LF (GAUZE/BANDAGES/DRESSINGS) ×3 IMPLANT
BNDG ESMARK 6X9 LF (GAUZE/BANDAGES/DRESSINGS) ×3
CANISTER SUCT 3000ML PPV (MISCELLANEOUS) ×3 IMPLANT
CHLORAPREP W/TINT 26ML (MISCELLANEOUS) ×3 IMPLANT
COVER SURGICAL LIGHT HANDLE (MISCELLANEOUS) ×3 IMPLANT
DRAPE OEC MINIVIEW 54X84 (DRAPES) ×3 IMPLANT
DRAPE U-SHAPE 47X51 STRL (DRAPES) ×3 IMPLANT
DRSG MEPITEL 4X7.2 (GAUZE/BANDAGES/DRESSINGS) ×3 IMPLANT
DRSG PAD ABDOMINAL 8X10 ST (GAUZE/BANDAGES/DRESSINGS) ×3 IMPLANT
ELECT REM PT RETURN 9FT ADLT (ELECTROSURGICAL) ×3
ELECTRODE REM PT RTRN 9FT ADLT (ELECTROSURGICAL) ×1 IMPLANT
GAUZE SPONGE 4X4 12PLY STRL (GAUZE/BANDAGES/DRESSINGS) ×3 IMPLANT
GLOVE BIO SURGEON STRL SZ8 (GLOVE) ×3 IMPLANT
GLOVE BIOGEL PI IND STRL 8 (GLOVE) ×2 IMPLANT
GLOVE BIOGEL PI INDICATOR 8 (GLOVE) ×4
GLOVE ECLIPSE 7.5 STRL STRAW (GLOVE) ×3 IMPLANT
GOWN STRL REUS W/ TWL XL LVL3 (GOWN DISPOSABLE) ×2 IMPLANT
GOWN STRL REUS W/TWL XL LVL3 (GOWN DISPOSABLE) ×4
KIT BASIN OR (CUSTOM PROCEDURE TRAY) ×3 IMPLANT
KIT ROOM TURNOVER OR (KITS) ×3 IMPLANT
NEEDLE HYPO 25GX1X1/2 BEV (NEEDLE) ×3 IMPLANT
NS IRRIG 1000ML POUR BTL (IV SOLUTION) ×3 IMPLANT
PACK ORTHO EXTREMITY (CUSTOM PROCEDURE TRAY) ×3 IMPLANT
PAD ARMBOARD 7.5X6 YLW CONV (MISCELLANEOUS) ×6 IMPLANT
PAD CAST 4YDX4 CTTN HI CHSV (CAST SUPPLIES) ×1 IMPLANT
PADDING CAST COTTON 4X4 STRL (CAST SUPPLIES) ×2
SET CYSTO W/LG BORE CLAMP LF (SET/KITS/TRAYS/PACK) ×3 IMPLANT
SOAP 2 % CHG 4 OZ (WOUND CARE) ×3 IMPLANT
SPONGE LAP 4X18 X RAY DECT (DISPOSABLE) ×3 IMPLANT
SUCTION FRAZIER HANDLE 10FR (MISCELLANEOUS) ×2
SUCTION TUBE FRAZIER 10FR DISP (MISCELLANEOUS) ×1 IMPLANT
SUT ETHILON 3 0 FSL (SUTURE) ×3 IMPLANT
SUT PROLENE 6 0 C 1 30 (SUTURE) ×3 IMPLANT
SYR CONTROL 10ML LL (SYRINGE) ×3 IMPLANT
TOWEL OR 17X26 10 PK STRL BLUE (TOWEL DISPOSABLE) ×3 IMPLANT
TUBE CONNECTING 12'X1/4 (SUCTIONS) ×1
TUBE CONNECTING 12X1/4 (SUCTIONS) ×2 IMPLANT
TUBING CYSTO DISP (UROLOGICAL SUPPLIES) ×3 IMPLANT
YANKAUER SUCT BULB TIP NO VENT (SUCTIONS) ×3 IMPLANT

## 2016-03-31 NOTE — Brief Op Note (Signed)
03/31/2016  6:47 PM  PATIENT:  Flossie BuffyAngel R Levett  45 y.o. female  PRE-OPERATIVE DIAGNOSIS: 1.  Right forefoot laceration      2.  Retained foreign body right plantar forefoot  POST-OPERATIVE DIAGNOSIS:  1.  Right forefoot laceration 3 cm long      2.  Retained foreign body right plantar forefoot      3.  Laceration of right plantar digital nerve to the hallux  Procedure(s): 1.  Exploration of right foot laceration with complicated removal of foreign body from from right foot 2.  Irrigation and excisional debridement of right foot laceration including skin, subcutaneous tissue and muscle 3.  AP and lateral xrays of the right foot 4.  Repair of plantar digital nerve to the right hallux 5.  Simple closure of right foot laceration 3 cm long  SURGEON:  Toni ArthursJohn Allex Madia, MD  ASSISTANT: n/a  ANESTHESIA:   General  EBL:  minimal   TOURNIQUET:  approx 30 min with ankle esmarch  COMPLICATIONS:  None apparent  DISPOSITION:  Extubated, awake and stable to recovery.  DICTATION ID:  [id number]
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
## 2016-03-31 NOTE — ED Notes (Signed)
Pt stepped on glass this am. Pt has 1" laceration to side of R big toe. No other injuries.

## 2016-03-31 NOTE — ED Notes (Signed)
UPON ARRIVAL TO THE ROOM, THE PT WAS VERY UPSET. PT STATES, "WHY AM I BACK HERE? I'VE BEEN WAITING HERE FOR 2 HOURS, AND NOBODY HAS DONE NOTHING BUT WRAP MY FOOT. I HAVE TO GET HOME TO GET MY KIDS. WHEN AM I GOING TO SEE A DOCTOR? I CAN JUST GO HOME, BECAUSE IT'S NOT THAT SERIOUS, IF IT WAS, YA'LL WOULD HAVE BEEN DONE SOMETHING ABOUT MY FOOT! I'M NOT GOING TO DIE." I EXPLAINED TO HER THAT SHE HAS GLASS LODGED IN HER FOOT,A ND IT'S A BIG RISK FOR INFECTION. PT STATES, "I'M GOING TO WRITE A BIG COMPLAINT ABOUT THIS HOSPITAL! I WAITED FOR 2 HOURS IN A ROOM, AND NEVER SEEN A DOCTOR. I'M GOING TO LEAVE. I HATE [facility]. I SHOULD'VE NEVER MOVED HERE!"
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
## 2016-03-31 NOTE — ED Notes (Addendum)
I PROCEEDED TO UPDATE THE PT ON THE TREATMENT PLAN FOR HER, SHE STATED, "WHAT TIME AM I GOING TO SURGERY? I STATED THAT I WAS NOT SURE. PT STATES, "IF I DON'T FIND OUT BY 4PM, I'M LEAVING." CHARGE NURSE MADE AWARE.

## 2016-03-31 NOTE — ED Provider Notes (Signed)
CSN: [id number]     Arrival date & time 03/31/16  1056 History   First MD Initiated Contact with Patient 03/31/16 1159     Chief Complaint  Patient presents with  . Foot Injury     (Consider location/radiation/quality/duration/timing/severity/associated sxs/prior Treatment) HPI.... Patient stepped on glass approximately 7 AM this morning. Now complains of pain in same. No other injuries. Last tetanus  3 years ago.  Severity of pain is moderate.  Past Medical History  Diagnosis Date  . Hypertension   . Meniere disease 2014   Past Surgical History  Procedure Laterality Date  . Appendectomy    . Cesarean section w/btl     History reviewed. No pertinent family history. Social History  Substance Use Topics  . Smoking status: Current Every Day Smoker  . Smokeless tobacco: None  . Alcohol Use: No   OB History    No data available     Review of Systems  All other systems reviewed and are negative.     Allergies  Chocolate and Peanut-containing drug products  Home Medications   Prior to Admission medications   Medication Sig Start Date End Date Taking? Authorizing Provider  acetaminophen (TYLENOL) 500 MG tablet Take 2,000 mg by mouth every 6 (six) hours as needed for mild pain, moderate pain, fever or headache.   Yes Historical Provider, MD  diphenhydrAMINE (BENADRYL) 25 MG tablet Take 50 mg by mouth every 4 (four) hours as needed for allergies.   Yes Historical Provider, MD  hydrochlorothiazide (HYDRODIURIL) 25 MG tablet Take 25 mg by mouth daily with breakfast.   Yes Historical Provider, MD  meclizine (ANTIVERT) 50 MG tablet Take 250-300 mg by mouth daily as needed for dizziness.   Yes Historical Provider, MD  methocarbamol (ROBAXIN) 750 MG tablet Take 750 mg by mouth 5 (five) times daily as needed for muscle spasms.   Yes Historical Provider, MD  omeprazole (PRILOSEC) 40 MG capsule Take 40 mg by mouth daily.   Yes Historical Provider, MD  ondansetron (ZOFRAN-ODT) 4 MG  disintegrating tablet Take 8-12 mg by mouth every 4 (four) hours as needed for nausea or vomiting.   Yes Historical Provider, MD  amoxicillin (AMOXIL) 500 MG capsule Take 1 capsule (500 mg total) by mouth 3 (three) times daily. Patient not taking: Reported on 03/31/2016 02/17/16   Tharon AquasFrank C Patrick, PA   BP 141/95 mmHg  Pulse 76  Temp(Src) 98 F (36.7 C) (Oral)  Resp 17  SpO2 100%  LMP 03/20/2016 Physical Exam  Constitutional: She is oriented to person, place, and time. She appears well-developed and well-nourished.  Obese, no acute distress  HENT:  Head: Normocephalic and atraumatic.  Eyes: Conjunctivae and EOM are normal. Pupils are equal, round, and reactive to light.  Neck: Normal range of motion. Neck supple.  Cardiovascular: Normal rate and regular rhythm.   Pulmonary/Chest: Effort normal and breath sounds normal.  Abdominal: Soft. Bowel sounds are normal.  Musculoskeletal: Normal range of motion.  Neurological: She is alert and oriented to person, place, and time.  Skin:  Right foot: 2 entrance sites each approximately 2-3 cm long;   first site medial aspect of the first toe at the MTP joint;  second site on the mid plantar aspect of the forefoot;  neurovascular intact  Psychiatric: She has a normal mood and affect. Her behavior is normal.  Nursing note and vitals reviewed.   ED Course  Procedures (including critical care time) Labs Review Labs Reviewed  CBC WITH DIFFERENTIAL/PLATELET -  Abnormal; Notable for the following:    RDW 16.8 (*)    All other components within normal limits  BASIC METABOLIC PANEL - Abnormal; Notable for the following:    Glucose, Bld 101 (*)    All other components within normal limits    Imaging Review Dg Foot Complete Right  03/31/2016  CLINICAL DATA:  Laceration, stepped on broken glass this morning, possible foreign body EXAM: RIGHT FOOT COMPLETE - 3+ VIEW COMPARISON:  None. FINDINGS: Three views of the right foot submitted. No acute  fracture or subluxation. At least 3 fragments of glass foreign body noted plantar aspect within soft tissue in the region of base of first toe and distal aspect first metatarsal. The largest most laterally measures 1 cm. The mid fragment measures at least 2 cm by 1 cm. A triangular shape fragment which sharp point measures at least 1.8 by 0.7 cm. IMPRESSION: No acute fracture or subluxation. At least 3 fragments of glass foreign body noted plantar aspect within soft tissue in the region of base of first toe and distal aspect first metatarsal. The largest most laterally measures 1 cm. The mid fragment measures at least 2 cm by 1 cm. A triangular shape fragment which sharp point measures at least 1.8 by 0.7 cm. These results were called by telephone at the time of interpretation on 03/31/2016 at 12:15 pm to Dr. Dalene Seltzer, who verbally acknowledged these results. Electronically Signed   By: Natasha Mead M.D.   On: 03/31/2016 12:16   I have personally reviewed and evaluated these images and lab results as part of my medical decision-making.   EKG Interpretation None      MDM   Final diagnoses:  Foreign body in right foot, initial encounter    Clinical scenario discussed with orthopedic surgeon on-call Dr. Victorino Dike.  Patient will be transferred to short stay at [facility].    Donnetta Hutching, MD 03/31/16 [phone]

## 2016-03-31 NOTE — ED Notes (Signed)
PT IN A HURRY TO LEAVE, DID NOT ALLOW ME TO INSERT AN IV. RELAYED TO [facility].

## 2016-03-31 NOTE — Progress Notes (Signed)
Orthopedic Tech Progress Note Patient Details:  Debbie Jordan [birth year] [id number]  Ortho Devices Type of Ortho Device: Postop shoe/boot Ortho Device/Splint Location: RLE foot Ortho Device/Splint Interventions: Ordered, Application   Debbie Jordan, Debbie Jordan 03/31/2016, 8:16 PM

## 2016-03-31 NOTE — Transfer of Care (Signed)
Immediate Anesthesia Transfer of Care Note  Patient: Debbie Jordan  Procedure(s) Performed: Procedure(s): IRRIGATION AND DEBRIDEMENT EXTREMITY AND REMOVAL OF FOREIGN BODY/RIGHT FOOT (Right)  Patient Location: PACU  Anesthesia Type:General  Level of Consciousness: awake, alert , oriented and patient cooperative  Airway & Oxygen Therapy: Patient Spontanous Breathing and Patient connected to nasal cannula oxygen  Post-op Assessment: Report given to RN and Post -op Vital signs reviewed and stable  Post vital signs: Reviewed and stable  Last Vitals:  Filed Vitals:   03/31/16 1059 03/31/16 1515  BP: 141/95 144/102  Pulse: 76 74  Temp: 36.7 C 36.4 C  Resp: 17 20    Last Pain:  Filed Vitals:   03/31/16 1516  PainSc: 10-Worst pain ever      Patients Stated Pain Goal: 3 (03/31/16 1515)  Complications: No apparent anesthesia complications

## 2016-03-31 NOTE — ED Notes (Signed)
PT UPDATED ON HER CARE PLAN. PT AGREED  TO BE TRANSFERRED TO [place] FOR FURTHER TREATMENT. THEN PT STATES, "DO I HAVE TO STAY OVERNIGHT AFTER THE SURGERY?" I STATED THAT THE SURGEON WOULD HAVE THAT INFORMATION FOR HER. PT STATED, "THIS IS RIDICULOUS. THE WORD ON THE STREET IS, IF YOU GO TO [place] FOR SURGERY, YOU DIE! I'M JUST READY TO GO. THIS STUFF SHOULD'VE BEEN DONE 4 HOURS AGO!"

## 2016-03-31 NOTE — Anesthesia Preprocedure Evaluation (Addendum)
Anesthesia Evaluation  Patient identified by MRN, date of birth, ID band Patient awake    Reviewed: Allergy & Precautions, NPO status , Patient's Chart, lab work & pertinent test results  Airway Mallampati: II  TM Distance: >3 FB Neck ROM: Full    Dental  (+) Missing, Dental Advisory Given   Pulmonary Current Smoker,    breath sounds clear to auscultation       Cardiovascular hypertension, Pt. on medications  Rhythm:Regular Rate:Normal     Neuro/Psych PSYCHIATRIC DISORDERS Bipolar Disorder negative neurological ROS     GI/Hepatic Neg liver ROS, GERD  Medicated,  Endo/Other  negative endocrine ROS  Renal/GU negative Renal ROS  negative genitourinary   Musculoskeletal negative musculoskeletal ROS (+)   Abdominal (+)  Abdomen: soft.    Peds negative pediatric ROS (+)  Hematology negative hematology ROS (+)   Anesthesia Other Findings   Reproductive/Obstetrics negative OB ROS                            Lab Results  Component Value Date   WBC 6.2 03/31/2016   HGB 12.3 03/31/2016   HCT 37.1 03/31/2016   MCV 83.2 03/31/2016   PLT 322 03/31/2016   Lab Results  Component Value Date   CREATININE 0.73 03/31/2016   BUN 14 03/31/2016   NA 142 03/31/2016   K 3.7 03/31/2016   CL 107 03/31/2016   CO2 28 03/31/2016     Anesthesia Physical Anesthesia Plan  ASA: III  Anesthesia Plan: General   Post-op Pain Management:    Induction: Intravenous  Airway Management Planned: LMA  Additional Equipment:   Intra-op Plan:   Post-operative Plan: Extubation in OR  Informed Consent: I have reviewed the patients History and Physical, chart, labs and discussed the procedure including the risks, benefits and alternatives for the proposed anesthesia with the patient or authorized representative who has indicated his/her understanding and acceptance.   Dental advisory given  Plan Discussed  with: CRNA  Anesthesia Plan Comments:         Anesthesia Quick Evaluation

## 2016-03-31 NOTE — Discharge Instructions (Signed)
Debbie ArthursJohn Denni France, MD [facility]  Please read the following information regarding your care after surgery.  Medications  You only need a prescription for the narcotic pain medicine (ex. oxycodone, Percocet, Norco).  All of the other medicines listed below are available over the counter. X Norco as prescribed for severe pain X Ibuprofen (ex. Advil, Motrin) 800 mg every 8 hours for pain  Narcotic pain medicine (ex. oxycodone, Percocet, Vicodin) will cause constipation.  To prevent this problem, take the following medicines while you are taking any pain medicine. X docusate sodium (Colace) 100 mg twice a day X senna (Senokot) 2 tablets twice a day  Weight Bearing ? Bear weight when you are able on your operated leg or foot. X Bear weight on your operated foot in the post-op shoe. ? Do not bear any weight on the operated leg or foot.  Cast / Splint / Dressing X Keep your dressing clean and dry.  Dont put anything (coat hanger, pencil, etc) down inside of it.  If it gets damp, use a hair dryer on the cool setting to dry it.  If it gets soaked, call the office to schedule an appointment for a dressing change. ? Remove your dressing 3 days after surgery and cover the incisions with dry dressings.    After your dressing, cast or splint is removed; you may shower, but do not soak or scrub the wound.  Allow the water to run over it, and then gently pat it dry.  Swelling It is normal for you to have swelling where you had surgery.  To reduce swelling and pain, keep your toes above your nose for at least 3 days after surgery.  It may be necessary to keep your foot or leg elevated for several weeks.  If it hurts, it should be elevated.  Follow Up Call my office at [phone] when you are discharged from the hospital or surgery center to schedule an appointment to be seen two weeks after surgery.  Call my office at [phone] if you develop a fever >101.5 F, nausea, vomiting, bleeding  from the surgical site or severe pain.

## 2016-03-31 NOTE — OR Nursing (Signed)
Pt was discharged from facility at 2025. Pt friend Addison NaegeliJeanetta and pt were given discharge instructions and denied any questions regarding information. Phone number for MD office was also discussed if they were to have any questions. Prescription pain medication was discussed as well. Pt was wheeled out in a wheel chair to friend Jeanetta's car. Pt was given crackers and sprite to drink prior to d/c and tolerated it well. Pt will f/u in 2 weeks with Dr. Victorino DikeHewitt

## 2016-04-01 NOTE — Anesthesia Postprocedure Evaluation (Signed)
Anesthesia Post Note  Patient: Debbie Jordan  Procedure(s) Performed: Procedure(s) (LRB): IRRIGATION AND DEBRIDEMENT EXTREMITY AND REMOVAL OF FOREIGN BODY/RIGHT FOOT (Right)  Patient location during evaluation: PACU Anesthesia Type: General Level of consciousness: awake and alert Pain management: pain level controlled Vital Signs Assessment: post-procedure vital signs reviewed and stable Respiratory status: spontaneous breathing, nonlabored ventilation, respiratory function stable and patient connected to nasal cannula oxygen Cardiovascular status: blood pressure returned to baseline and stable Postop Assessment: no signs of nausea or vomiting Anesthetic complications: no    Last Vitals:  Filed Vitals:   03/31/16 1900 03/31/16 1915  BP: 149/88 152/84  Pulse: 69 70  Temp:    Resp: 25 11    Last Pain:  Filed Vitals:   03/31/16 2107  PainSc: 1                  Shelton SilvasKevin D Jamela Cumbo

## 2016-04-01 NOTE — Op Note (Signed)
Debbie Debbie Jordan, Debbie Jordan NO.:  [id number]  MEDICAL RECORD NO.:  [id number]  LOCATION:  [facility]                         FACILITY:  [facility]  PHYSICIAN:  Toni Arthurs, MD        DATE OF BIRTH:  February 07, 1971  DATE OF PROCEDURE:  03/31/2016 DATE OF DISCHARGE:  03/31/2016                              OPERATIVE REPORT   PREOPERATIVE DIAGNOSES: 1. Right forefoot laceration. 2. Retained body, right plantar forefoot.  POSTOPERATIVE DIAGNOSES: 1. Right forefoot laceration, 3 cm long. 2. Retained foreign body, right plantar forefoot (3 pieces of glass). 3. Laceration of right plantar digital nerve to the hallux.  PROCEDURE: 1. Exploration of right forefoot laceration with complicated removal     of foreign body. 2. Irrigation and excisional debridement of right foot laceration     including skin, subcutaneous tissue, and muscle. 3. AP and lateral radiographs of the right foot. 4. Repair of the plantar digital nerve to the right hallux. 5. Simple closure of right foot laceration, 3 cm in length.  SURGEON:  Toni Arthurs, MD.  ANESTHESIA:  General.  ESTIMATED BLOOD LOSS:  Minimal.  TOURNIQUET TIME:  Approximately 30 minutes with an ankle Esmarch.  COMPLICATIONS:  None apparent.  DISPOSITION:  Extubated, awake, and stable to recovery.  INDICATION FOR PROCEDURE:  The patient is a 45 year old woman, who stepped on glass this morning while barefoot.  She sustained a laceration to the right medial forefoot, presented to the emergency room at [facility].  Radiographs showed 3 fragments of glass retained in the plantar soft tissues beneath the first metatarsal head.  She presents now for exploration of the wound; irrigation, debridement, and repair of any damaged structures as well as removal of the foreign bodies.  She understands the risks and benefits, the alternative treatment options, and elects surgical treatment.  She specifically understands risks of bleeding,  infection, nerve damage, blood clots, need for additional surgery, continued pain, amputation, and death.  PROCEDURE IN DETAIL:  After preoperative consent was obtained and the correct operative site was identified, the patient was brought to the operating room and placed supine on the operating table.  General anesthesia was induced.  Preoperative antibiotics were administered. Surgical time-out was taken.  The right lower extremity was prepped and draped in standard sterile fashion.  The foot was exsanguinated and a 4- inch Esmarch tourniquet was wrapped around the ankle.  The patient's medial laceration was then examined.  It was noted to be 3 cm in length at the glabrous border of the skin, plantar and medial to the first metatarsal head.  Immediately evident was a fragment of glass.  This was removed without difficulty.  The wound was then carefully probed and a 2nd fragment of glass was identified.  This was removed as well.  At this point, the incision was extended proximally and distally.  The wound was then carefully dissected down through the subcutaneous tissues.  There was no evidence of any more glass at that time.  The wound was then carefully debrided from the level of the skin down through the subcutaneous tissues to the level of the abductor hallucis muscle.  Scalpel was used to  sharply dissect all necrotic and nonviable appearing material.  AP and lateral radiographs were then obtained. This showed another piece of glass proximal and plantar to the first metatarsal head.  Blunt dissection was then carried into this area under fluoroscopic guidance.  The final fragment glass was then grasped with a hemostat and removed in its entirety.  AP and lateral radiographs were again obtained, and these show interval removal of all glass fragments from the soft tissues.  The wound was then irrigated copiously with approximately 2 L normal saline.  The wound was again  carefully examined.  The plantar medial digital nerve to the hallux was identified.  It was noted to be lacerated in its entirety.  The cut ends of the nerve were freshened with a scalpel.  6-0 Prolene sutures were used to repair the nerve in epineurial fashion.  The wound was again irrigated.  Simple and horizontal mattress sutures of 3-0 nylon were used to close the skin incision.  Sterile dressings were applied, followed by a compression wrap.  Tourniquet was released after application of the dressings at approximately 30 minutes.  The patient was then awakened anesthesia and transported to recovery room in stable condition.  FOLLOWUP PLAN:  The patient will be discharged to night to home.  She will have a flat postop shoe wear and will follow up with me in the office in 2 weeks for suture removal.  RADIOGRAPHS:  AP and lateral radiographs of the right foot are obtained intraoperatively.  These show interval removal of foreign bodies from the soft tissue beneath the first metatarsal head.  No other acute injuries are noted.     Toni ArthursJohn Alden Bensinger, MD     JH/MEDQ  D:  03/31/2016  T:  04/01/2016  Job:  [id number]

## 2016-04-02 ENCOUNTER — Encounter (HOSPITAL_COMMUNITY): Payer: Self-pay | Admitting: Orthopedic Surgery

## 2021-03-13 ENCOUNTER — Other Ambulatory Visit (HOSPITAL_BASED_OUTPATIENT_CLINIC_OR_DEPARTMENT_OTHER): Payer: Self-pay
